# Patient Record
Sex: Male | Born: 1985 | Race: Black or African American | Hispanic: No | Marital: Single | Smoking: Never smoker
Health system: Southern US, Community
[De-identification: ages and names within clinical notes are randomized; demographics above are authoritative.]

## PROBLEM LIST (undated history)

## (undated) DIAGNOSIS — F79 Unspecified intellectual disabilities: Secondary | ICD-10-CM

## (undated) DIAGNOSIS — Q02 Microcephaly: Secondary | ICD-10-CM

## (undated) DIAGNOSIS — G809 Cerebral palsy, unspecified: Secondary | ICD-10-CM

## (undated) DIAGNOSIS — F840 Autistic disorder: Secondary | ICD-10-CM

## (undated) DIAGNOSIS — J189 Pneumonia, unspecified organism: Secondary | ICD-10-CM

## (undated) DIAGNOSIS — R5600 Simple febrile convulsions: Secondary | ICD-10-CM

## (undated) HISTORY — PX: DENTAL SURGERY: SHX609

---

## 2010-08-18 ENCOUNTER — Emergency Department (HOSPITAL_COMMUNITY): Admission: EM | Admit: 2010-08-18 | Payer: Self-pay | Source: Home / Self Care

## 2012-07-13 DIAGNOSIS — J189 Pneumonia, unspecified organism: Secondary | ICD-10-CM

## 2012-07-13 HISTORY — DX: Pneumonia, unspecified organism: J18.9

## 2013-02-13 ENCOUNTER — Inpatient Hospital Stay (HOSPITAL_COMMUNITY): Payer: Medicaid Other

## 2013-02-13 ENCOUNTER — Emergency Department (HOSPITAL_COMMUNITY): Payer: Medicaid Other

## 2013-02-13 ENCOUNTER — Encounter (HOSPITAL_COMMUNITY): Payer: Self-pay | Admitting: Radiology

## 2013-02-13 ENCOUNTER — Inpatient Hospital Stay (HOSPITAL_COMMUNITY)
Admission: EM | Admit: 2013-02-13 | Discharge: 2013-02-17 | DRG: 922 | Disposition: A | Discharge status: Home / Self Care | Payer: Medicaid Other | Attending: Internal Medicine | Admitting: Internal Medicine

## 2013-02-13 DIAGNOSIS — R4182 Altered mental status, unspecified: Secondary | ICD-10-CM | POA: Diagnosis present

## 2013-02-13 DIAGNOSIS — E872 Acidosis, unspecified: Secondary | ICD-10-CM | POA: Diagnosis present

## 2013-02-13 DIAGNOSIS — J690 Pneumonitis due to inhalation of food and vomit: Secondary | ICD-10-CM | POA: Diagnosis not present

## 2013-02-13 DIAGNOSIS — E876 Hypokalemia: Secondary | ICD-10-CM

## 2013-02-13 DIAGNOSIS — R6510 Systemic inflammatory response syndrome (SIRS) of non-infectious origin without acute organ dysfunction: Secondary | ICD-10-CM

## 2013-02-13 DIAGNOSIS — R000 Tachycardia, unspecified: Secondary | ICD-10-CM | POA: Diagnosis present

## 2013-02-13 DIAGNOSIS — J189 Pneumonia, unspecified organism: Secondary | ICD-10-CM

## 2013-02-13 DIAGNOSIS — G9340 Encephalopathy, unspecified: Secondary | ICD-10-CM

## 2013-02-13 DIAGNOSIS — T6701XA Heatstroke and sunstroke, initial encounter: Principal | ICD-10-CM

## 2013-02-13 DIAGNOSIS — Q02 Microcephaly: Secondary | ICD-10-CM

## 2013-02-13 DIAGNOSIS — R1110 Vomiting, unspecified: Secondary | ICD-10-CM | POA: Diagnosis not present

## 2013-02-13 DIAGNOSIS — X30XXXA Exposure to excessive natural heat, initial encounter: Secondary | ICD-10-CM | POA: Diagnosis present

## 2013-02-13 DIAGNOSIS — F840 Autistic disorder: Secondary | ICD-10-CM | POA: Diagnosis present

## 2013-02-13 DIAGNOSIS — I959 Hypotension, unspecified: Secondary | ICD-10-CM | POA: Diagnosis present

## 2013-02-13 DIAGNOSIS — IMO0002 Reserved for concepts with insufficient information to code with codable children: Secondary | ICD-10-CM | POA: Diagnosis not present

## 2013-02-13 DIAGNOSIS — J9600 Acute respiratory failure, unspecified whether with hypoxia or hypercapnia: Secondary | ICD-10-CM

## 2013-02-13 DIAGNOSIS — R5600 Simple febrile convulsions: Secondary | ICD-10-CM | POA: Diagnosis present

## 2013-02-13 DIAGNOSIS — T6701XD Heatstroke and sunstroke, subsequent encounter: Secondary | ICD-10-CM

## 2013-02-13 HISTORY — DX: Autistic disorder: F84.0

## 2013-02-13 HISTORY — DX: Unspecified intellectual disabilities: F79

## 2013-02-13 HISTORY — DX: Simple febrile convulsions: R56.00

## 2013-02-13 HISTORY — DX: Microcephaly: Q02

## 2013-02-13 LAB — URINE MICROSCOPIC-ADD ON

## 2013-02-13 LAB — CK: Total CK: 154 U/L (ref 7–232)

## 2013-02-13 LAB — POCT I-STAT 3, VENOUS BLOOD GAS (G3P V)
Acid-base deficit: 10 mmol/L — ABNORMAL HIGH (ref 0.0–2.0)
Bicarbonate: 18.3 meq/L — ABNORMAL LOW (ref 20.0–24.0)
O2 Saturation: 89 %
TCO2: 20 mmol/L (ref 0–100)
pCO2, Ven: 48.6 mmHg (ref 45.0–50.0)
pH, Ven: 7.183 — CL (ref 7.250–7.300)
pO2, Ven: 71 mmHg — ABNORMAL HIGH (ref 30.0–45.0)

## 2013-02-13 LAB — URINALYSIS, ROUTINE W REFLEX MICROSCOPIC
Bilirubin Urine: NEGATIVE
Glucose, UA: NEGATIVE mg/dL
Glucose, UA: 250 mg/dL — AB
Hgb urine dipstick: NEGATIVE
Ketones, ur: NEGATIVE mg/dL
Ketones, ur: NEGATIVE mg/dL
Nitrite: NEGATIVE
Protein, ur: 30 mg/dL — AB
Protein, ur: NEGATIVE mg/dL
Specific Gravity, Urine: 1.014 (ref 1.005–1.030)
Urobilinogen, UA: 0.2 mg/dL (ref 0.0–1.0)
pH: 7 (ref 5.0–8.0)

## 2013-02-13 LAB — COMPREHENSIVE METABOLIC PANEL
ALT: 13 U/L (ref 0–53)
AST: 24 U/L (ref 0–37)
Alkaline Phosphatase: 61 U/L (ref 39–117)
CO2: 20 mEq/L (ref 19–32)
Chloride: 107 mEq/L (ref 96–112)
GFR calc non Af Amer: 67 mL/min — ABNORMAL LOW (ref >90)
Sodium: 141 mEq/L (ref 135–145)
Total Bilirubin: 0.2 mg/dL — ABNORMAL LOW (ref 0.3–1.2)

## 2013-02-13 LAB — BLOOD GAS, ARTERIAL
Acid-base deficit: 6.2 mmol/L — ABNORMAL HIGH (ref 0.0–2.0)
FIO2: 0.5 %
MECHVT: 450 mL
Patient temperature: 98.6
TCO2: 19.9 mmol/L (ref 0–100)

## 2013-02-13 LAB — CBC WITH DIFFERENTIAL/PLATELET
Basophils Absolute: 0 10*3/uL (ref 0.0–0.1)
HCT: 41.6 % (ref 39.0–52.0)
Lymphocytes Relative: 66 % — ABNORMAL HIGH (ref 12–46)
Monocytes Absolute: 0.3 10*3/uL (ref 0.1–1.0)
Neutro Abs: 1.3 10*3/uL — ABNORMAL LOW (ref 1.7–7.7)
Platelets: 177 10*3/uL (ref 150–400)
RBC: 4.79 MIL/uL (ref 4.22–5.81)
RDW: 13.9 % (ref 11.5–15.5)
WBC: 4.8 10*3/uL (ref 4.0–10.5)

## 2013-02-13 LAB — CG4 I-STAT (LACTIC ACID): Lactic Acid, Venous: 3.81 mmol/L — ABNORMAL HIGH (ref 0.5–2.2)

## 2013-02-13 MED ORDER — SODIUM CHLORIDE 0.9 % IV SOLN
INTRAVENOUS | Status: DC
  Administered 2013-02-13 – 2013-02-15 (×3): via INTRAVENOUS

## 2013-02-13 MED ORDER — SODIUM CHLORIDE 0.9 % IV BOLUS (SEPSIS)
1000.0000 mL | Freq: Once | INTRAVENOUS | Status: AC
  Administered 2013-02-13: 1000 mL via INTRAVENOUS

## 2013-02-13 MED ORDER — FENTANYL BOLUS VIA INFUSION
25.0000 ug | Freq: Four times a day (QID) | INTRAVENOUS | Status: DC | PRN
  Filled 2013-02-13: qty 100

## 2013-02-13 MED ORDER — LORAZEPAM 2 MG/ML IJ SOLN
INTRAMUSCULAR | Status: AC
  Administered 2013-02-13: 2 mg via INTRAVENOUS
  Filled 2013-02-13: qty 1

## 2013-02-13 MED ORDER — SODIUM CHLORIDE 0.9 % IV SOLN
25.0000 ug/h | INTRAVENOUS | Status: DC
  Filled 2013-02-13 (×2): qty 50

## 2013-02-13 MED ORDER — SODIUM CHLORIDE 0.9 % IV SOLN: INTRAVENOUS | Status: DC

## 2013-02-13 MED ORDER — FAMOTIDINE IN NACL 20-0.9 MG/50ML-% IV SOLN
20.0000 mg | Freq: Two times a day (BID) | INTRAVENOUS | Status: DC
  Administered 2013-02-13 – 2013-02-16 (×6): 20 mg via INTRAVENOUS
  Filled 2013-02-13 (×7): qty 50

## 2013-02-13 MED ORDER — ROCURONIUM BROMIDE 50 MG/5ML IV SOLN
50.0000 mg | Freq: Once | INTRAVENOUS | Status: AC
  Administered 2013-02-13: 50 mg via INTRAVENOUS

## 2013-02-13 MED ORDER — MIDAZOLAM HCL 2 MG/2ML IJ SOLN: 2.0000 mg | Freq: Once | INTRAMUSCULAR | Status: DC

## 2013-02-13 MED ORDER — POTASSIUM CHLORIDE 10 MEQ/100ML IV SOLN
INTRAVENOUS | Status: AC
  Administered 2013-02-13: 10 meq
  Filled 2013-02-13: qty 400

## 2013-02-13 MED ORDER — DEXTROSE 5 % IV SOLN
1.0000 g | INTRAVENOUS | Status: DC
  Administered 2013-02-13 – 2013-02-16 (×4): 1 g via INTRAVENOUS
  Filled 2013-02-13 (×5): qty 10

## 2013-02-13 MED ORDER — KCL IN DEXTROSE-NACL 40-5-0.45 MEQ/L-%-% IV SOLN
INTRAVENOUS | Status: DC
  Filled 2013-02-13 (×3): qty 1000

## 2013-02-13 MED ORDER — POTASSIUM CHLORIDE 10 MEQ/100ML IV SOLN
10.0000 meq | INTRAVENOUS | Status: AC
  Administered 2013-02-13 (×4): 10 meq via INTRAVENOUS

## 2013-02-13 MED ORDER — SODIUM CHLORIDE 0.9 % IV SOLN
20.0000 ug/h | INTRAVENOUS | Status: DC
  Administered 2013-02-13: 20 ug/h via INTRAVENOUS
  Filled 2013-02-13: qty 50

## 2013-02-13 MED ORDER — LORAZEPAM 2 MG/ML IJ SOLN: 2.0000 mg | Freq: Once | INTRAMUSCULAR | Status: AC

## 2013-02-13 MED ORDER — DEXTROSE 5 % IV SOLN: 1.0000 g | INTRAVENOUS | Status: DC

## 2013-02-13 MED ORDER — SODIUM CHLORIDE 0.9 % IV SOLN
1.0000 mg/h | INTRAVENOUS | Status: DC
  Administered 2013-02-13: 1 mg/h via INTRAVENOUS
  Filled 2013-02-13: qty 10

## 2013-02-13 MED ORDER — NOREPINEPHRINE BITARTRATE 1 MG/ML IJ SOLN
2.0000 ug/min | INTRAVENOUS | Status: DC
  Filled 2013-02-13: qty 16

## 2013-02-13 MED ORDER — ETOMIDATE 2 MG/ML IV SOLN
15.0000 mg | Freq: Once | INTRAVENOUS | Status: AC
  Administered 2013-02-13: 15 mg via INTRAVENOUS

## 2013-02-13 MED ORDER — SODIUM CHLORIDE 0.9 % IV SOLN: 250.0000 mL | INTRAVENOUS | Status: DC | PRN

## 2013-02-13 MED ORDER — SODIUM CHLORIDE 0.9 % IV SOLN
250.0000 mL | INTRAVENOUS | Status: DC | PRN
  Administered 2013-02-14: 250 mL via INTRAVENOUS

## 2013-02-13 MED ORDER — VANCOMYCIN HCL IN DEXTROSE 750-5 MG/150ML-% IV SOLN
750.0000 mg | INTRAVENOUS | Status: DC
  Administered 2013-02-14 – 2013-02-16 (×3): 750 mg via INTRAVENOUS
  Filled 2013-02-13 (×3): qty 150

## 2013-02-13 MED ORDER — MIDAZOLAM HCL 2 MG/2ML IJ SOLN: 2.0000 mg | INTRAMUSCULAR | Status: DC | PRN

## 2013-02-13 NOTE — ED Notes (Signed)
MD made aware of temp trend. bair hugger placed on pt at this time.

## 2013-02-13 NOTE — ED Notes (Signed)
Capnography placed at this time pt 31

## 2013-02-13 NOTE — Progress Notes (Signed)
eLink Physician-Brief Progress Note Patient Name: Corey Mckenzie DOB: [birth year] MRN: [id number]  Date of Service  02/13/2013   HPI/Events of Note   MAP 50, lactic 3.9  eICU Interventions  Levophed, a line, bc sent, add vanc, line to consider   Intervention Category Major Interventions: Hypotension - evaluation and management  Patrycja Mumpower J. 02/13/2013, 10:20 PM
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
## 2013-02-13 NOTE — ED Notes (Signed)
Rate changed 20 fio2 60% peep 5 tv 400

## 2013-02-13 NOTE — ED Notes (Signed)
MD made aware of pt's temp. Pt changed to warm fluids, warm blankets applied

## 2013-02-13 NOTE — ED Notes (Signed)
[place] 6 L added at a this time

## 2013-02-13 NOTE — ED Notes (Signed)
MD at bedside. 

## 2013-02-13 NOTE — ED Notes (Signed)
MD at bedside. simonds

## 2013-02-13 NOTE — ED Notes (Signed)
[place] [employer] at bedside 

## 2013-02-13 NOTE — Progress Notes (Signed)
Patient transported to CT and back to 2100 without any complications.  Patient is slightly hypotensive.  Was given a bolus while in CT.  Sats 100%.  RT will continue to monitor.

## 2013-02-13 NOTE — ED Notes (Signed)
Ghim, MD notified of abnormal lab test results

## 2013-02-13 NOTE — Progress Notes (Signed)
Pt admitted from ED at 1705.  Pt placed on monitor. Pt intubated with fentanyl and versed infusing on admission. OGT replaced after pt admitted.  Mother at bedside will continue to monitor.

## 2013-02-13 NOTE — ED Notes (Signed)
RT called to pull back ETT pt now 23 at top lip
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
## 2013-02-13 NOTE — Progress Notes (Signed)
eLink Physician-Brief Progress Note Patient Name: Corey Mckenzie DOB: 03-Aug-1985 MRN: [id number]  Date of Service  02/13/2013   HPI/Events of Note   Rn called concern seziure, posturing At risk brain edema  eICU Interventions  Stat head ct (temp normal now, hard to blame hypertherthermia) Ativan, treat k, increase fluids, abg   Intervention Category Major Interventions: Seizures - evaluation and management  Nelda Bucks. 02/13/2013, 7:49 PM
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
## 2013-02-13 NOTE — ED Notes (Signed)
Rate seetinTidal volume 400 rate 12 peep 9fio2 80

## 2013-02-13 NOTE — ED Notes (Signed)
Cooling blanket applied at this time.
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
## 2013-02-13 NOTE — ED Notes (Signed)
Pt intubated 7.5 25 at the lips. + color change bilateral breath sounds.
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
## 2013-02-14 ENCOUNTER — Encounter (HOSPITAL_COMMUNITY): Payer: Self-pay | Admitting: General Practice

## 2013-02-14 ENCOUNTER — Inpatient Hospital Stay (HOSPITAL_COMMUNITY): Payer: Medicaid Other

## 2013-02-14 DIAGNOSIS — J9600 Acute respiratory failure, unspecified whether with hypoxia or hypercapnia: Secondary | ICD-10-CM

## 2013-02-14 LAB — CORTISOL: Cortisol, Plasma: 19.9 ug/dL

## 2013-02-14 LAB — URINE CULTURE
Colony Count: NO GROWTH
Culture: NO GROWTH

## 2013-02-14 LAB — CBC
HCT: 41.2 % (ref 39.0–52.0)
MCHC: 34.7 g/dL (ref 30.0–36.0)
Platelets: 78 10*3/uL — ABNORMAL LOW (ref 150–400)
RDW: 14.7 % (ref 11.5–15.5)

## 2013-02-14 LAB — BASIC METABOLIC PANEL
BUN: 12 mg/dL (ref 6–23)
Creatinine, Ser: 0.88 mg/dL (ref 0.50–1.35)
GFR calc Af Amer: >90 mL/min (ref >90)
GFR calc non Af Amer: >90 mL/min (ref >90)
Potassium: 4 mEq/L (ref 3.5–5.1)

## 2013-02-14 MED ORDER — ACETAMINOPHEN 650 MG RE SUPP
650.0000 mg | RECTAL | Status: DC | PRN
  Administered 2013-02-14: 650 mg via RECTAL
  Filled 2013-02-14: qty 1

## 2013-02-14 MED ORDER — WHITE PETROLATUM GEL
Status: AC
  Administered 2013-02-14: 20:00:00
  Filled 2013-02-14: qty 5

## 2013-02-14 MED ORDER — CHLORHEXIDINE GLUCONATE 0.12 % MT SOLN
15.0000 mL | Freq: Two times a day (BID) | OROMUCOSAL | Status: DC
  Administered 2013-02-14 – 2013-02-15 (×2): 15 mL via OROMUCOSAL
  Filled 2013-02-14: qty 15

## 2013-02-14 MED ORDER — FENTANYL BOLUS VIA INFUSION
50.0000 ug | Freq: Once | INTRAVENOUS | Status: DC
  Filled 2013-02-14: qty 50

## 2013-02-14 MED ORDER — BIOTENE DRY MOUTH MT LIQD
15.0000 mL | Freq: Four times a day (QID) | OROMUCOSAL | Status: DC
  Administered 2013-02-15 – 2013-02-16 (×7): 15 mL via OROMUCOSAL

## 2013-02-14 MED ORDER — ACETAMINOPHEN 325 MG PO TABS
650.0000 mg | ORAL_TABLET | ORAL | Status: DC | PRN
  Administered 2013-02-15 – 2013-02-16 (×4): 650 mg via ORAL
  Filled 2013-02-14 (×5): qty 2

## 2013-02-14 MED ORDER — CHLORHEXIDINE GLUCONATE 0.12 % MT SOLN
OROMUCOSAL | Status: AC
  Filled 2013-02-14: qty 15

## 2013-02-14 MED ORDER — FENTANYL CITRATE 0.05 MG/ML IJ SOLN
INTRAMUSCULAR | Status: AC
  Administered 2013-02-14: 18:00:00
  Filled 2013-02-14: qty 2

## 2013-02-14 MED ORDER — ONDANSETRON HCL 4 MG/2ML IJ SOLN: 4.0000 mg | Freq: Four times a day (QID) | INTRAMUSCULAR | Status: DC | PRN

## 2013-02-14 MED ORDER — SODIUM CHLORIDE 0.9 % IV SOLN
1.0000 g | Freq: Once | INTRAVENOUS | Status: AC
  Administered 2013-02-14: 1 g via INTRAVENOUS
  Filled 2013-02-14: qty 10

## 2013-02-14 MED ORDER — ONDANSETRON HCL 4 MG/2ML IJ SOLN
INTRAMUSCULAR | Status: AC
  Filled 2013-02-14: qty 2

## 2013-02-14 NOTE — Progress Notes (Signed)
Patient extubated to Liters, mentation at baseline. Limited ability to follow commands. Tolerated well. Current BP 103/49, heart rate 105. Dangling in bed. Slightly lethargic. Family and caretaker at bedside

## 2013-02-14 NOTE — Progress Notes (Signed)
Attempted times two to place NGT with times three persons. Patient fighting and struggling against NGT placement. Family at bedside. Dr. Tyson Alias aware.

## 2013-02-14 NOTE — Progress Notes (Signed)
Patient taken to CT and back by this RN, transporter and respiratory therapist. Patient slightly hypotensive in CT and was given 1L bolus. Patient family at bedside.

## 2013-02-14 NOTE — Progress Notes (Signed)
Chaplain Note:  Chaplain visited with pt and pt's family.  Pt was in bed, awake, alert, and restless.  Pt's family and friends were bedside in support of pt.  Chaplain provided spiritual comfort, support, and prayer for pt and pt's family.  Pt's family and nurse expressed appreciation for chaplain support.  Chaplain will follow up as needed.  02/14/13 1400  Clinical Encounter Type  Visited With Patient and family together  Visit Type Spiritual support  Referral From Nurse  Spiritual Encounters  Spiritual Needs Prayer;Emotional  Stress Factors  Patient Stress Factors Health changes;Loss of control  Family Stress Factors Loss of control  Verdie Shire, [address] [phone]

## 2013-02-14 NOTE — Progress Notes (Signed)
Foley discontinued at 1220

## 2013-02-14 NOTE — Progress Notes (Signed)
eLink Physician-Brief Progress Note Patient Name: Corey Mckenzie DOB: Aug 30, 1985 MRN: [id number]  Date of Service  02/14/2013   HPI/Events of Note  Corrected calcium of 7.1   eICU Interventions  Plan: Calcium replaced   Intervention Category Intermediate Interventions: Electrolyte abnormality - evaluation and management  DETERDING,ELIZABETH 02/14/2013, 5:19 AM

## 2013-02-14 NOTE — Progress Notes (Signed)
Called to evaluate patient for respiratory weaning.  After evaluation, ABG was noted and spoke with the family, patient is baseline non-verbal and non communicative.  After observing for 15 minutes, decision was made to extubate.  Will extubate but maintain full code for now.  Additional CC time 40 min.  Alyson Reedy, M.D. [facility] Pulmonary/Critical Care Medicine. Pager: [phone]. After hours pager: [phone].
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
## 2013-02-14 NOTE — Procedures (Signed)
Extubation Procedure Note  Patient Details:   Name: Corey Mckenzie DOB: 10-01-1985 MRN: [id number]   Airway Documentation:     Evaluation  O2 sats: stable throughout Complications: No apparent complications Patient did not tolerate procedure well. Bilateral Breath Sounds: Clear Suctioning: Airway Yes Pt extubated per MD order and placed on 4L . No stridor or other complications noted. RT will monitor.   Kristian Covey Arlene 02/14/2013, 9:41 AM

## 2013-02-14 NOTE — Progress Notes (Signed)
Started weaning at 8:37. 5/5, patient tolerating well. Currently on fentanyl 100
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
## 2013-02-14 NOTE — Progress Notes (Signed)
eLink Physician-Brief Progress Note Patient Name: Corey Mckenzie DOB: 1986/06/12 MRN: [id number]  Date of Service  02/14/2013   HPI/Events of Note  New infiltrate fro vomiting, awake, rr noted, maintaining airway well for now   eICU Interventions  Place ngt to suction, kub   Intervention Category Major Interventions: Respiratory failure - evaluation and management  FEINSTEIN,DANIEL J. 02/14/2013, 4:00 PM

## 2013-02-15 DIAGNOSIS — J189 Pneumonia, unspecified organism: Secondary | ICD-10-CM | POA: Diagnosis present

## 2013-02-15 DIAGNOSIS — Z5189 Encounter for other specified aftercare: Secondary | ICD-10-CM

## 2013-02-15 LAB — BASIC METABOLIC PANEL
BUN: 8 mg/dL (ref 6–23)
CO2: 14 mEq/L — ABNORMAL LOW (ref 19–32)
Calcium: 7.8 mg/dL — ABNORMAL LOW (ref 8.4–10.5)
Chloride: 104 mEq/L (ref 96–112)
Creatinine, Ser: 0.8 mg/dL (ref 0.50–1.35)
Glucose, Bld: 70 mg/dL (ref 70–99)
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
## 2013-02-16 DIAGNOSIS — J9600 Acute respiratory failure, unspecified whether with hypoxia or hypercapnia: Secondary | ICD-10-CM

## 2013-02-16 LAB — CBC
HCT: 44.4 % (ref 39.0–52.0)
MCH: 26.3 pg (ref 26.0–34.0)
MCHC: 31.5 g/dL (ref 30.0–36.0)
MCV: 83.3 fL (ref 78.0–100.0)
Platelets: 81 10*3/uL — ABNORMAL LOW (ref 150–400)
RDW: 14.5 % (ref 11.5–15.5)
WBC: 10 10*3/uL (ref 4.0–10.5)

## 2013-02-16 LAB — BASIC METABOLIC PANEL
BUN: 8 mg/dL (ref 6–23)
Calcium: 8.1 mg/dL — ABNORMAL LOW (ref 8.4–10.5)
Creatinine, Ser: 0.74 mg/dL (ref 0.50–1.35)
GFR calc Af Amer: >90 mL/min (ref >90)

## 2013-02-16 MED ORDER — ENSURE COMPLETE PO LIQD
237.0000 mL | Freq: Two times a day (BID) | ORAL | Status: DC
  Administered 2013-02-16 – 2013-02-17 (×2): 237 mL via ORAL

## 2013-02-16 MED ORDER — KCL IN DEXTROSE-NACL 10-5-0.45 MEQ/L-%-% IV SOLN
INTRAVENOUS | Status: DC
  Administered 2013-02-16 – 2013-02-17 (×2): via INTRAVENOUS
  Filled 2013-02-16 (×4): qty 1000

## 2013-02-16 MED ORDER — POTASSIUM CHLORIDE 10 MEQ/100ML IV SOLN
10.0000 meq | INTRAVENOUS | Status: AC
  Administered 2013-02-16 (×4): 10 meq via INTRAVENOUS
  Filled 2013-02-16 (×5): qty 100
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
## 2013-02-16 NOTE — Progress Notes (Signed)
Report given to [facility] on 6N. Last set of vitals were taken. O2 96 HR 102 Resp 32 BP 97/69 (75). Pt is being transferred to 6N11
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
## 2013-02-17 DIAGNOSIS — G9340 Encephalopathy, unspecified: Secondary | ICD-10-CM

## 2013-02-17 DIAGNOSIS — E872 Acidosis, unspecified: Secondary | ICD-10-CM

## 2013-02-17 DIAGNOSIS — T6701XA Heatstroke and sunstroke, initial encounter: Secondary | ICD-10-CM

## 2013-02-17 MED ORDER — LEVOFLOXACIN 25 MG/ML PO SOLN: 500.0000 mg | Freq: Every day | ORAL | Status: DC

## 2013-02-17 MED ORDER — POTASSIUM CHLORIDE 10 MEQ/100ML IV SOLN
10.0000 meq | INTRAVENOUS | Status: AC
  Administered 2013-02-17 (×2): 10 meq via INTRAVENOUS
  Filled 2013-02-17: qty 200
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
## 2013-02-17 NOTE — Progress Notes (Signed)
DC home with grandmother and brother. They both verbally understood DC instructions, no questions asked.
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
## 2013-02-19 LAB — CULTURE, BLOOD (ROUTINE X 2): Culture: NO GROWTH

## 2014-03-22 IMAGING — CT CT HEAD W/O CM
1 series · 16 of 30 positions shown, 20 images · non-contrast
Comparison: None.

CLINICAL DATA: Seizure activity

CT HEAD WITHOUT CONTRAST
TECHNIQUE: Contiguous axial images were obtained from the base of
the skull through the vertex without contrast.

[Series 2: head 5.0 h30s · axial · 0.46mm/px · z∈[-101,+39]mm · 16 of 32 slices shown, 20 images]
[im 2/32  brain]
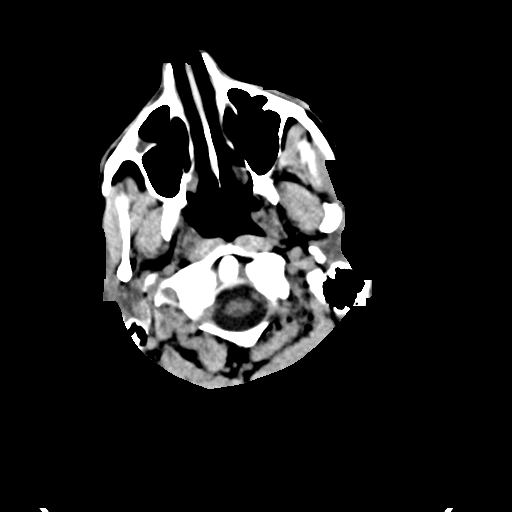
[im 2/32  bone]
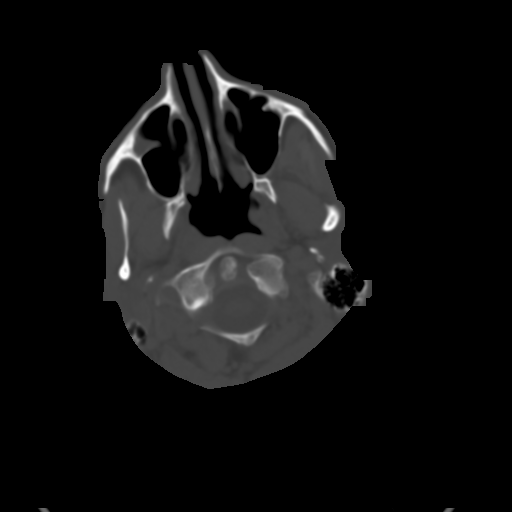
[im 4/32  brain]
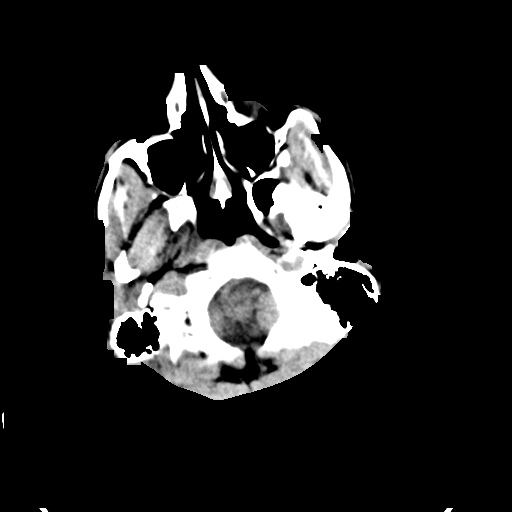
[im 6/32  brain]
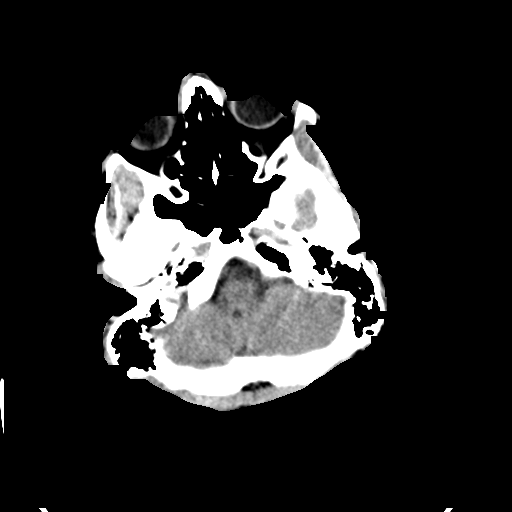
[im 8/32  brain]
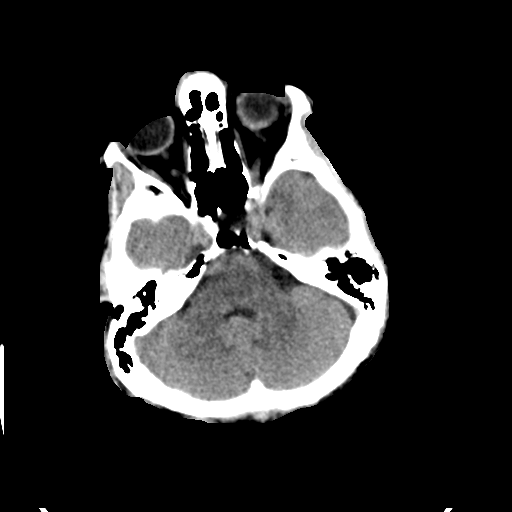
[im 9/32  brain]
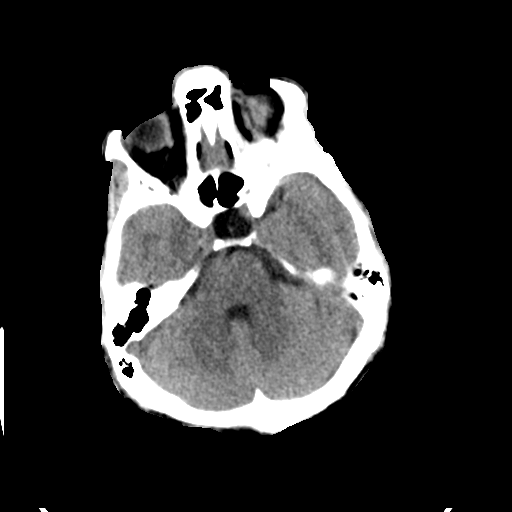
[im 9/32  bone]
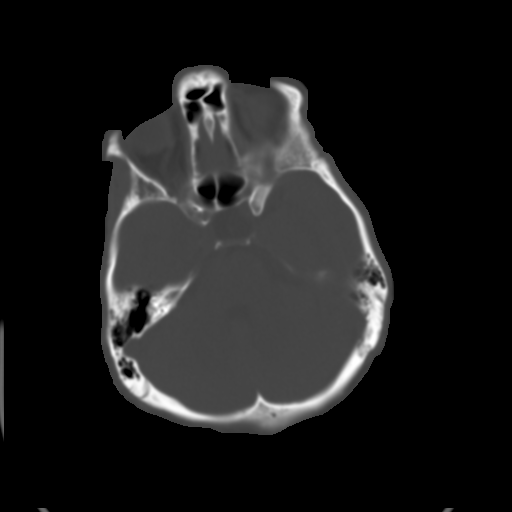
[im 11/32  brain]
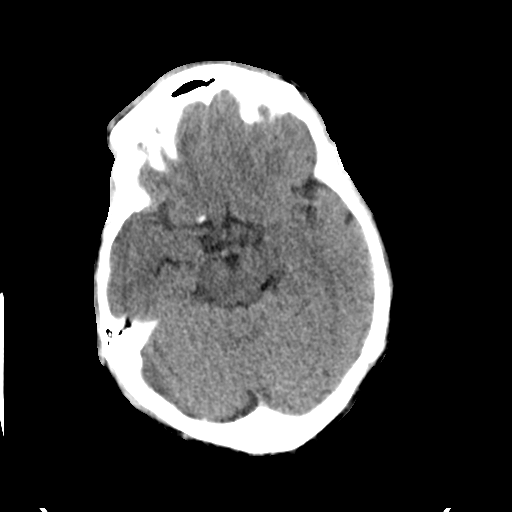
[im 13/32  brain]
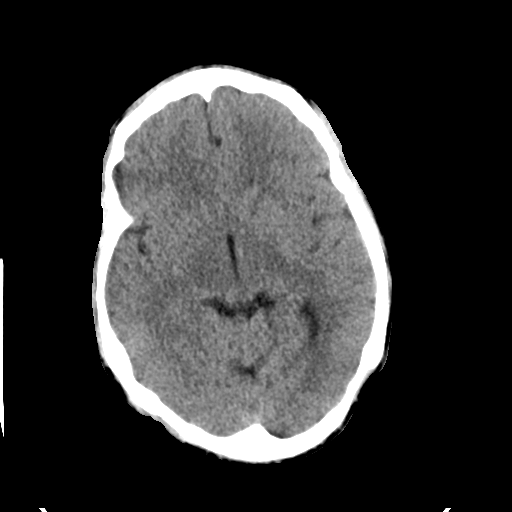
[im 15/32  brain]
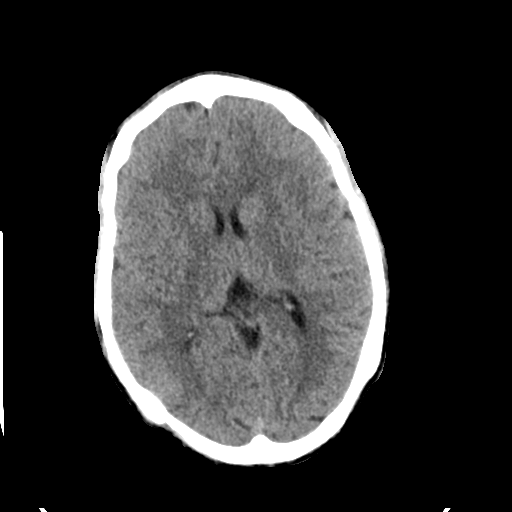
[im 17/32  brain]
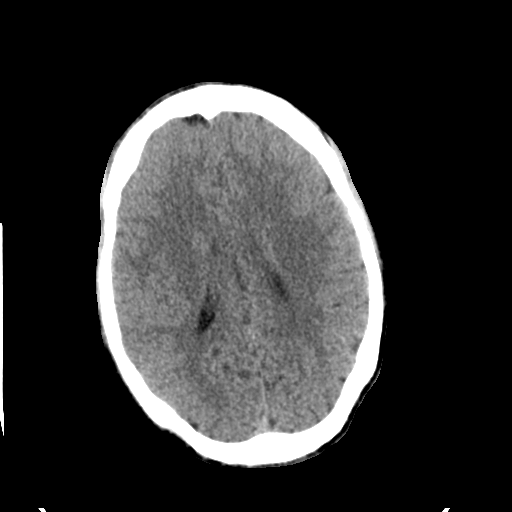
[im 17/32  bone]
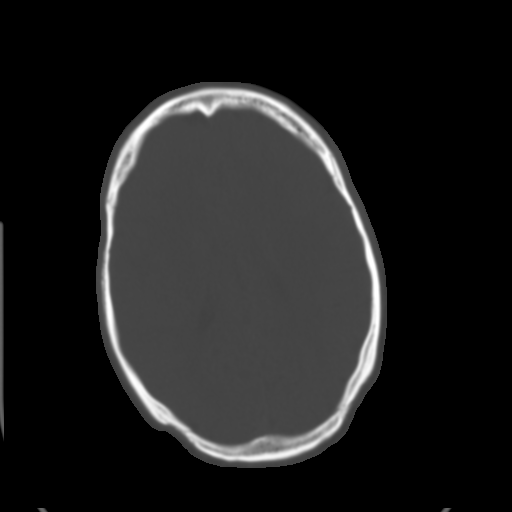
[im 19/32  brain]
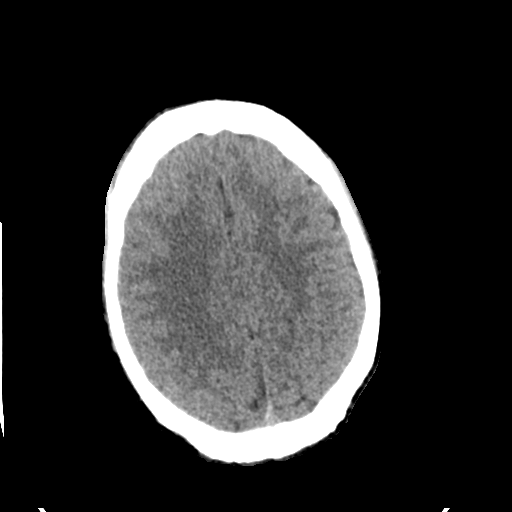
[im 21/32  brain]
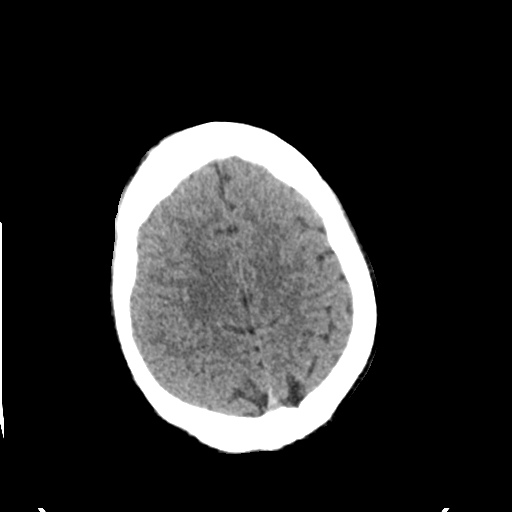
[im 23/32  brain]
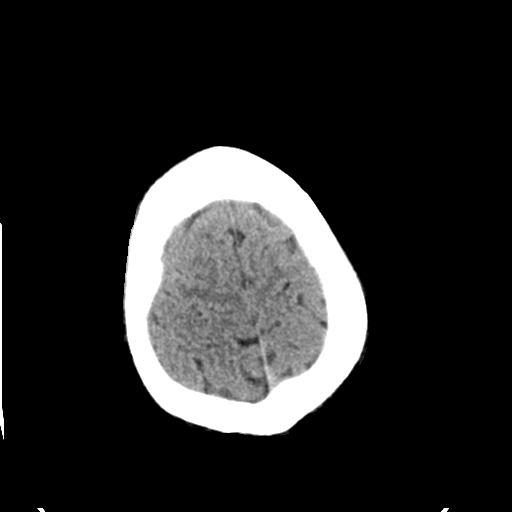
[im 24/32  brain]
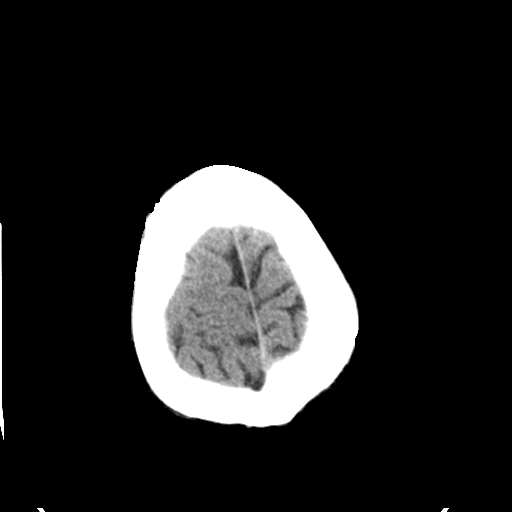
[im 24/32  bone]
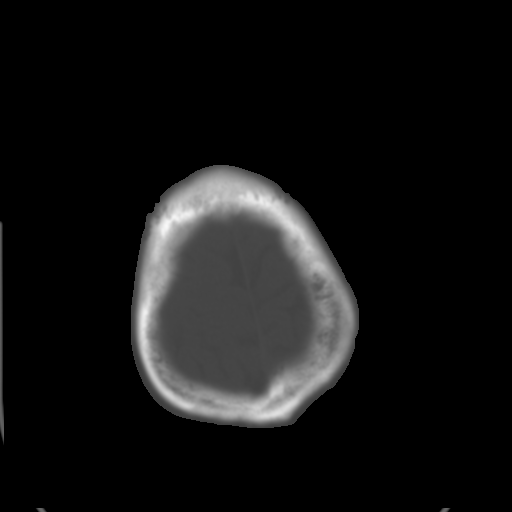
[im 26/32  brain]
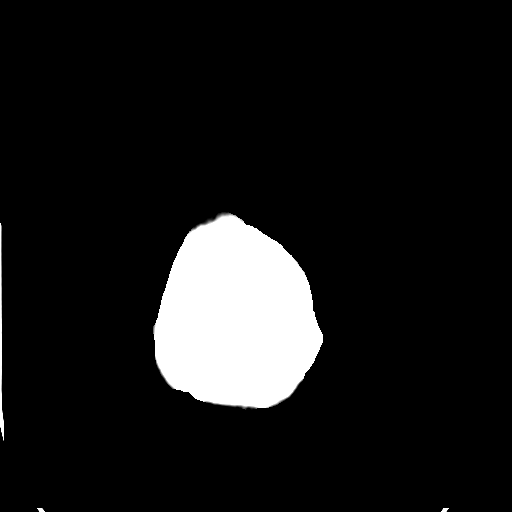
[im 28/32  brain]
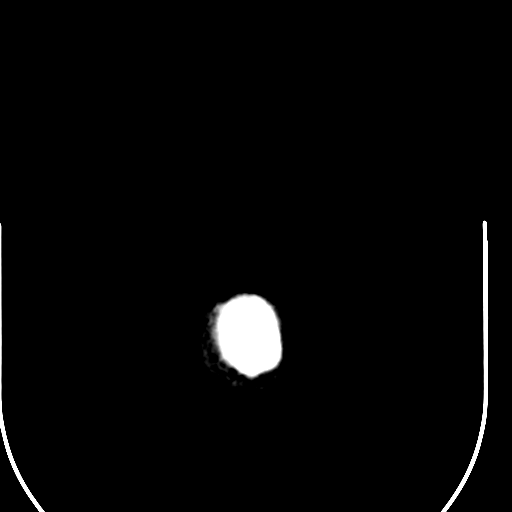
[im 30/32  brain]
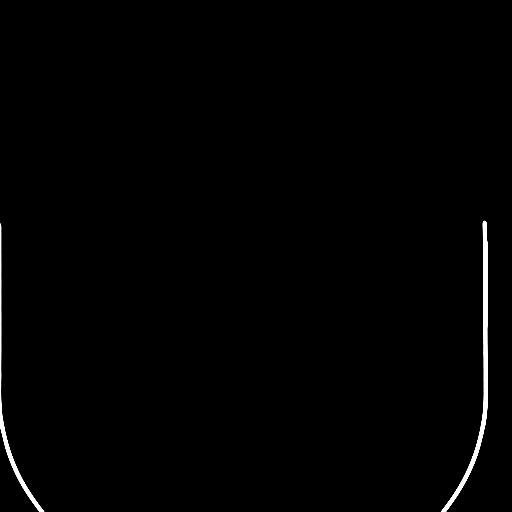

[16 of 30 positions shown; findings below may reference images not displayed]

FINDINGS: No acute intracranial hemorrhage, mass lesion,
infarction, mass effect, hydrocephalus, herniation, or extra-axial
fluid collection.  Gray-white matter differentiation maintained.
Cisterns patent.  No cerebellar abnormality.  Symmetric orbits.
Mastoids clear.  Minor mucosal thickening in the right maxillary
sinus.  Other sinuses clear.
IMPRESSION: No acute intracranial finding

## 2014-03-23 IMAGING — CR DG CHEST 1V PORT
1 series · 1 of 1 positions shown · non-contrast
Comparison: [DATE] [DATE], [DATE] [DATE] a.m.

CLINICAL DATA: Shortness of breath and vomiting

PORTABLE CHEST - 1 VIEW

[AP]
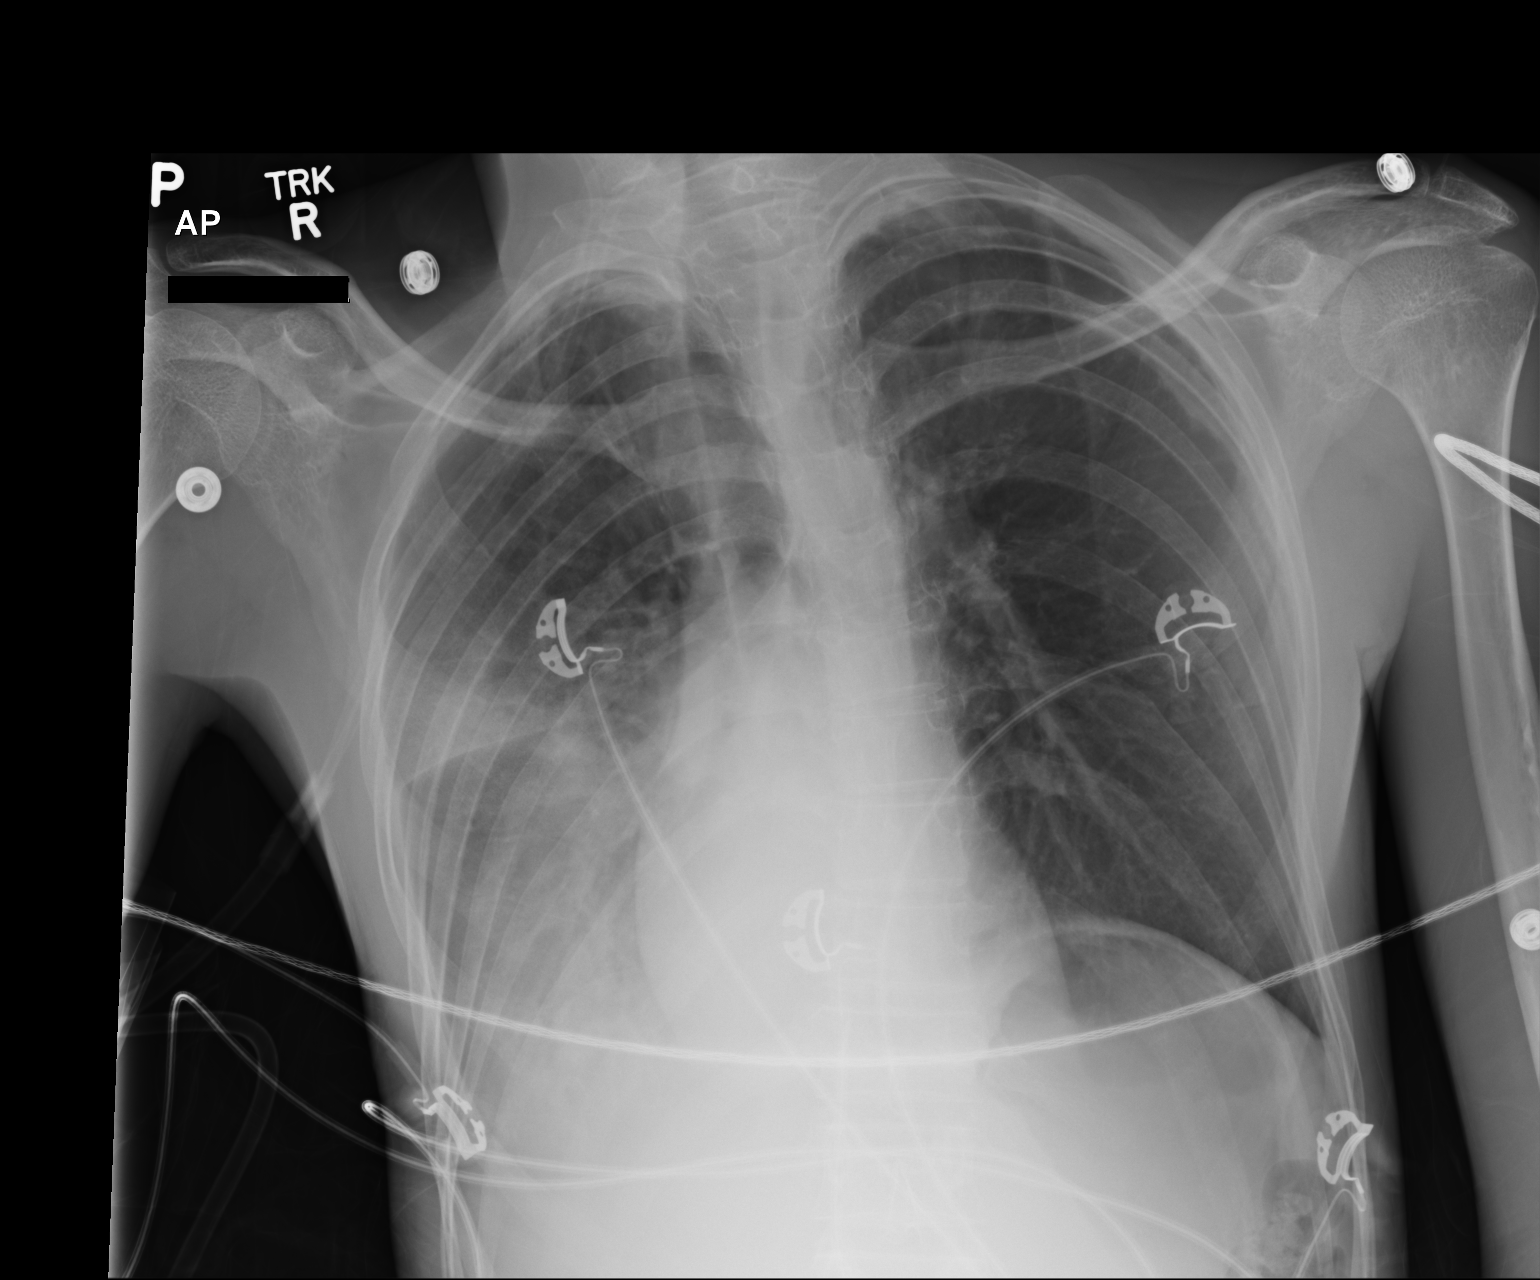

[1 of 1 positions shown; findings below may reference images not displayed]

FINDINGS: Previously noted endotracheal tube has been removed.
Previously noted nasogastric tube has been removed.  There is
consolidation of right mid and lung base with right pleural
effusion worse compared prior exam.  The left lung is clear.  The
heart size is normal.
IMPRESSION: Right pleural effusion and consolidation of right mid and lung base
worse compared to prior exam.

## 2014-06-20 ENCOUNTER — Other Ambulatory Visit: Payer: Self-pay | Admitting: Dentistry

## 2014-06-20 NOTE — Pre-Procedure Instructions (Signed)
Beryl NestJason A Hatler  06/20/2014   Your procedure is scheduled on:  Friday, June 29, 2014 at 12:40 PM.   [facility] Entrance "A" Admitting Offfice at 10:40 AM.   Call this number if you have problems the morning of surgery: [phone]                Any questions prior to day of surgery, please call [phone] between 8 & 4PM.    Remember:   Do not eat food or drink liquids after midnight.   Take these medicines the morning of surgery with A SIP OF WATER:    Do not wear jewelry.  Do not wear lotions, powders, or cologne. You may wear deodorant.  Men may shave face and neck.  Do not bring valuables to the hospital.  [facility] is not responsible                  for any belongings or valuables.               Contacts, dentures or bridgework may not be worn into surgery.  Leave suitcase in the car. After surgery it may be brought to your room.  For patients admitted to the hospital, discharge time is determined by your                treatment team.               Patients discharged the day of surgery will not be allowed to drive home.    Special Instructions: See "Preparing for Surgery"   Please read over the following fact sheets that you were given: Pain Booklet, Coughing and Deep Breathing and Surgical Site Infection Prevention

## 2014-06-21 ENCOUNTER — Encounter (HOSPITAL_COMMUNITY): Payer: Self-pay

## 2014-06-21 ENCOUNTER — Encounter (HOSPITAL_COMMUNITY)
Admission: RE | Admit: 2014-06-21 | Discharge: 2014-06-21 | Disposition: A | Discharge status: Home / Self Care | Payer: Medicaid Other | Source: Ambulatory Visit | Attending: Dentistry | Admitting: Dentistry

## 2014-06-21 DIAGNOSIS — Z01818 Encounter for other preprocedural examination: Secondary | ICD-10-CM | POA: Insufficient documentation

## 2014-06-21 DIAGNOSIS — F849 Pervasive developmental disorder, unspecified: Secondary | ICD-10-CM | POA: Diagnosis not present

## 2014-06-21 DIAGNOSIS — R5600 Simple febrile convulsions: Secondary | ICD-10-CM | POA: Insufficient documentation

## 2014-06-21 DIAGNOSIS — F840 Autistic disorder: Secondary | ICD-10-CM | POA: Diagnosis not present

## 2014-06-21 DIAGNOSIS — Q02 Microcephaly: Secondary | ICD-10-CM | POA: Diagnosis not present

## 2014-06-21 HISTORY — DX: Pneumonia, unspecified organism: J18.9

## 2014-06-21 NOTE — Pre-Procedure Instructions (Signed)
Corey Mckenzie  06/21/2014   Your procedure is scheduled on:  Friday, June 29, 2014 at 12:40 PM.   [facility] to [facility] Entrance "A" Admitting Offfice at 10:40 AM.   Call this number if you have problems the morning of surgery: [phone]                Any questions prior to day of surgery, please call [phone] between 8 & 4PM.    Remember:   Do not eat food or drink liquids after midnight.   Take these medicines the morning of surgery with A SIP OF WATER: NONE   Do not wear jewelry.  Do not wear lotions, powders, or cologne. You may wear deodorant.  Men may shave face and neck.  Do not bring valuables to the hospital.  [facility] is not responsible for any belongings or valuables.               Contacts, dentures or bridgework may not be worn into surgery.  Leave suitcase in the car. After surgery it may be brought to your room.  For patients admitted to the hospital, discharge time is determined by your treatment team.               Patients discharged the day of surgery will not be allowed to drive home.    Special Instructions: See "Preparing for Surgery"   Please read over the following fact sheets that you were given: Pain Booklet, Coughing and Deep Breathing and Surgical Site Infection Prevention

## 2014-06-21 NOTE — Progress Notes (Signed)
Patient is autistic. Patients mother Deneise at chair side during PAT visit. PCP is Dr. Juanita CraverGessemi in [place]. Office # [phone] at fax # [phone]. Patients mother informed Nurse that lab work could not be obtained today and that labs would need to be obtained after patient was asleep. Nurse called Revonda StandardAllison, PA and informed her of patient.
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
## 2014-06-28 NOTE — Progress Notes (Signed)
Patient aware of surgery time change to 1012 AM and arrival time to 0800 AM.

## 2014-06-29 ENCOUNTER — Ambulatory Visit (HOSPITAL_COMMUNITY): Payer: Medicaid Other | Admitting: Vascular Surgery

## 2014-06-29 ENCOUNTER — Encounter (HOSPITAL_COMMUNITY)
Admission: RE | Disposition: A | Discharge status: Home / Self Care | Payer: Self-pay | Source: Ambulatory Visit | Attending: Dentistry

## 2014-06-29 ENCOUNTER — Ambulatory Visit (HOSPITAL_COMMUNITY)
Admission: RE | Admit: 2014-06-29 | Discharge: 2014-06-29 | Disposition: A | Discharge status: Home / Self Care | Payer: Medicaid Other | Source: Ambulatory Visit | Attending: Dentistry | Admitting: Dentistry

## 2014-06-29 DIAGNOSIS — Z8669 Personal history of other diseases of the nervous system and sense organs: Secondary | ICD-10-CM | POA: Diagnosis not present

## 2014-06-29 DIAGNOSIS — K029 Dental caries, unspecified: Secondary | ICD-10-CM | POA: Insufficient documentation

## 2014-06-29 DIAGNOSIS — I10 Essential (primary) hypertension: Secondary | ICD-10-CM | POA: Diagnosis not present

## 2014-06-29 DIAGNOSIS — Z8701 Personal history of pneumonia (recurrent): Secondary | ICD-10-CM | POA: Insufficient documentation

## 2014-06-29 DIAGNOSIS — F849 Pervasive developmental disorder, unspecified: Secondary | ICD-10-CM | POA: Insufficient documentation

## 2014-06-29 DIAGNOSIS — F79 Unspecified intellectual disabilities: Secondary | ICD-10-CM | POA: Diagnosis not present

## 2014-06-29 DIAGNOSIS — Q02 Microcephaly: Secondary | ICD-10-CM | POA: Diagnosis not present

## 2014-06-29 HISTORY — PX: DENTAL RESTORATION/EXTRACTION WITH X-RAY: SHX5796

## 2014-06-29 SURGERY — DENTAL RESTORATION/EXTRACTION WITH X-RAY
Anesthesia: General | Site: Mouth

## 2014-06-29 MED ORDER — ONDANSETRON HCL 4 MG/2ML IJ SOLN
INTRAMUSCULAR | Status: AC
  Filled 2014-06-29: qty 2

## 2014-06-29 MED ORDER — SUCCINYLCHOLINE CHLORIDE 20 MG/ML IJ SOLN
INTRAMUSCULAR | Status: DC | PRN
  Administered 2014-06-29: 100 mg via INTRAVENOUS

## 2014-06-29 MED ORDER — LIDOCAINE HCL (CARDIAC) 20 MG/ML IV SOLN
INTRAVENOUS | Status: DC | PRN
  Administered 2014-06-29: 50 mg via INTRAVENOUS

## 2014-06-29 MED ORDER — ROCURONIUM BROMIDE 50 MG/5ML IV SOLN
INTRAVENOUS | Status: AC
  Filled 2014-06-29: qty 1

## 2014-06-29 MED ORDER — STERILE WATER FOR IRRIGATION IR SOLN
Status: DC | PRN
  Administered 2014-06-29: 1000 mL

## 2014-06-29 MED ORDER — PROPOFOL 10 MG/ML IV BOLUS
INTRAVENOUS | Status: AC
  Filled 2014-06-29: qty 20

## 2014-06-29 MED ORDER — LIDOCAINE-EPINEPHRINE 2 %-1:100000 IJ SOLN
INTRAMUSCULAR | Status: DC | PRN
Start: 2014-06-29 — End: 2014-06-29
  Administered 2014-06-29: 20 mL

## 2014-06-29 MED ORDER — LACTATED RINGERS IV SOLN
INTRAVENOUS | Status: DC | PRN
  Administered 2014-06-29: 11:00:00 via INTRAVENOUS

## 2014-06-29 MED ORDER — PHENYLEPHRINE HCL 10 MG/ML IJ SOLN
INTRAMUSCULAR | Status: DC | PRN
  Administered 2014-06-29 (×2): 80 ug via INTRAVENOUS

## 2014-06-29 MED ORDER — MIDAZOLAM HCL 2 MG/ML PO SYRP
ORAL_SOLUTION | ORAL | Status: AC
  Filled 2014-06-29: qty 10

## 2014-06-29 MED ORDER — HYDROMORPHONE HCL 1 MG/ML IJ SOLN: 0.2500 mg | INTRAMUSCULAR | Status: DC | PRN

## 2014-06-29 MED ORDER — LIDOCAINE-EPINEPHRINE 2 %-1:100000 IJ SOLN
INTRAMUSCULAR | Status: AC
  Filled 2014-06-29: qty 1

## 2014-06-29 MED ORDER — MIDAZOLAM HCL 2 MG/ML PO SYRP
20.0000 mg | ORAL_SOLUTION | Freq: Once | ORAL | Status: AC
  Administered 2014-06-29: 20 mg via ORAL

## 2014-06-29 MED ORDER — PROPOFOL 10 MG/ML IV BOLUS
INTRAVENOUS | Status: DC | PRN
  Administered 2014-06-29: 100 mg via INTRAVENOUS

## 2014-06-29 MED ORDER — LIDOCAINE HCL (CARDIAC) 20 MG/ML IV SOLN
INTRAVENOUS | Status: AC
  Filled 2014-06-29: qty 5

## 2014-06-29 MED ORDER — FENTANYL CITRATE 0.05 MG/ML IJ SOLN
INTRAMUSCULAR | Status: DC | PRN
  Administered 2014-06-29: 50 ug via INTRAVENOUS

## 2014-06-29 MED ORDER — ONDANSETRON HCL 4 MG/2ML IJ SOLN
INTRAMUSCULAR | Status: DC | PRN
  Administered 2014-06-29: 4 mg via INTRAVENOUS

## 2014-06-29 MED ORDER — FENTANYL CITRATE 0.05 MG/ML IJ SOLN
INTRAMUSCULAR | Status: AC
  Filled 2014-06-29: qty 5

## 2014-06-29 SURGICAL SUPPLY — 25 items
CANISTER SUCTION 2500CC (MISCELLANEOUS) ×3 IMPLANT
COVER PROBE W GEL 5X96 (DRAPES) ×3 IMPLANT
COVER SURGICAL LIGHT HANDLE (MISCELLANEOUS) ×3 IMPLANT
COVER TABLE BACK 60X90 (DRAPES) ×3 IMPLANT
DRAPE PROXIMA HALF (DRAPES) ×3 IMPLANT
ELECT REM PT RETURN 9FT ADLT (ELECTROSURGICAL) ×3
ELECTRODE REM PT RTRN 9FT ADLT (ELECTROSURGICAL) ×1 IMPLANT
GAUZE SPONGE 4X4 16PLY XRAY LF (GAUZE/BANDAGES/DRESSINGS) ×3 IMPLANT
GLOVE BIO SURGEON STRL SZ7.5 (GLOVE) ×3 IMPLANT
GOWN STRL REUS W/ TWL LRG LVL3 (GOWN DISPOSABLE) ×2 IMPLANT
GOWN STRL REUS W/TWL LRG LVL3 (GOWN DISPOSABLE) ×4
KIT BASIN OR (CUSTOM PROCEDURE TRAY) ×3 IMPLANT
KIT ROOM TURNOVER OR (KITS) ×3 IMPLANT
NEEDLE 25GX 5/8IN NON SAFETY (NEEDLE) ×3 IMPLANT
NEEDLE 27GAX1X1/2 (NEEDLE) IMPLANT
PAD ARMBOARD 7.5X6 YLW CONV (MISCELLANEOUS) ×6 IMPLANT
PENCIL BUTTON HOLSTER BLD 10FT (ELECTRODE) ×3 IMPLANT
SPONGE SURGIFOAM ABS GEL SZ50 (HEMOSTASIS) ×3 IMPLANT
SUT CHROMIC 3 0 PS 2 (SUTURE) ×3 IMPLANT
SYR CONTROL 10ML LL (SYRINGE) ×3 IMPLANT
TOOTHBRUSH ADULT (PERSONAL CARE ITEMS) ×3 IMPLANT
TOWEL OR 17X26 10 PK STRL BLUE (TOWEL DISPOSABLE) ×3 IMPLANT
TUBE CONNECTING 12'X1/4 (SUCTIONS) ×1
TUBE CONNECTING 12X1/4 (SUCTIONS) ×2 IMPLANT
YANKAUER SUCT BULB TIP NO VENT (SUCTIONS) ×3 IMPLANT

## 2014-06-29 NOTE — Anesthesia Preprocedure Evaluation (Addendum)
Anesthesia Evaluation  Patient identified by MRN, date of birth, ID band Patient awake    Reviewed: Allergy & Precautions, H&P , NPO status , Patient's Chart, lab work & pertinent test results  Airway Mallampati: III  TM Distance: >3 FB Neck ROM: Full    Dental no notable dental hx. (+) Teeth Intact, Dental Advisory Given   Pulmonary neg pulmonary ROS, pneumonia -, resolved,  breath sounds clear to auscultation  Pulmonary exam normal       Cardiovascular negative cardio ROS  Rhythm:Regular Rate:Normal     Neuro/Psych Seizures -, Well Controlled,  Autism Mental retardation negative psych ROS   GI/Hepatic negative GI ROS, Neg liver ROS,   Endo/Other  negative endocrine ROS  Renal/GU negative Renal ROS  negative genitourinary   Musculoskeletal   Abdominal   Peds  Hematology negative hematology ROS (+)   Anesthesia Other Findings   Reproductive/Obstetrics negative OB ROS                            Anesthesia Physical Anesthesia Plan  ASA: III  Anesthesia Plan: General   Post-op Pain Management:    Induction: Inhalational  Airway Management Planned: Nasal ETT and Video Laryngoscope Planned  Additional Equipment:   Intra-op Plan:   Post-operative Plan: Extubation in OR  Informed Consent: I have reviewed the patients History and Physical, chart, labs and discussed the procedure including the risks, benefits and alternatives for the proposed anesthesia with the patient or authorized representative who has indicated his/her understanding and acceptance.   Dental advisory given  Plan Discussed with: CRNA  Anesthesia Plan Comments:         Anesthesia Quick Evaluation

## 2014-06-29 NOTE — H&P (Signed)
H&P reviewed, no changes in assessment  

## 2014-06-29 NOTE — Discharge Instructions (Signed)
Given to mother

## 2014-06-29 NOTE — Transfer of Care (Signed)
Immediate Anesthesia Transfer of Care Note  Patient: Corey Mckenzie  Procedure(s) Performed: Procedure(s) with comments: DENTAL RESTORATION/EXTRACTION WITH X-RAY (N/A) - Recall exam, full mouth x-rays and cleaning, two extractions and fillings  Patient Location: PACU  Anesthesia Type:General  Level of Consciousness: awake  Airway & Oxygen Therapy: Patient Spontanous Breathing  Post-op Assessment: Report given to PACU RN  Post vital signs: Reviewed and stable  Complications: No apparent anesthesia complications

## 2014-06-29 NOTE — Op Note (Signed)
Recall Exam, FMX, FMD, Composites, Extractions 13, 15 with 5 cc 2% Lidocaine 1:100,000 epi, 15 cc EBL

## 2014-06-29 NOTE — Brief Op Note (Signed)
06/29/2014  1:30 PM  PATIENT:  Corey Mckenzie  28 y.o. male  PRE-OPERATIVE DIAGNOSIS:  DENTAL CARIES  POST-OPERATIVE DIAGNOSIS:  DENTAL CARIES  PROCEDURE:  Procedure(s): DENTAL RESTORATION/EXTRACTION WITH X-RAY (N/A)  SURGEON:  Surgeon(s) and Role:    * Esaw DaceWilliam E Jenah Vanasten Jr., DDS - Primary  PHYSICIAN ASSISTANT:   ASSISTANTS: Laqueta CarinaMary Elizabeth Andreyev   ANESTHESIA:   general  EBL:  Total I/O In: 300 [I.V.:300] Out: 15 [Blood:15]  BLOOD ADMINISTERED:none  DRAINS: none   LOCAL MEDICATIONS USED:  LIDOCAINE   SPECIMEN:  No Specimen  DISPOSITION OF SPECIMEN:  N/A  COUNTS:  YES  TOURNIQUET:  * No tourniquets in log *  DICTATION: .Note written in EPIC  PLAN OF CARE: Discharge to home after PACU  PATIENT DISPOSITION:  PACU - hemodynamically stable.   Delay start of Pharmacological VTE agent (>24hrs) due to surgical blood loss or risk of bleeding: not applicable

## 2014-06-29 NOTE — Progress Notes (Signed)
Dr Martie Roundmilner at bedside d/c instructions given

## 2014-06-29 NOTE — Anesthesia Postprocedure Evaluation (Signed)
  Anesthesia Post-op Note  Patient: Corey Mckenzie  Procedure(s) Performed: Procedure(s) with comments: DENTAL RESTORATION/EXTRACTION WITH X-RAY (N/A) - Recall exam, full mouth x-rays and cleaning, two extractions and fillings  Patient Location: PACU  Anesthesia Type:General  Level of Consciousness: awake and alert   Airway and Oxygen Therapy: Patient Spontanous Breathing  Post-op Pain: none  Post-op Assessment: Post-op Vital signs reviewed, Patient's Cardiovascular Status Stable and Respiratory Function Stable  Post-op Vital Signs: Reviewed  Filed Vitals:   06/29/14 1430  BP: 112/74  Pulse: 94  Temp: 36.6 C  Resp: 22    Complications: No apparent anesthesia complications

## 2014-06-30 NOTE — Op Note (Signed)
NAMSherene Mckenzie:  Stallbaumer, [place]                   ACCOUNT NO.:  [id number]  MEDICAL RECORD NO.:  [id number]  LOCATION:  [facility]                         FACILITY:  [facility]  PHYSICIAN:  Esaw DaceWilliam E. 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  Dental care provided in the OR for medically necessary treatment.  OPERATION:  Full-mouth oral rehabilitation including exam, x-rays, cleaning, and extractions.  OPERATIVE SURGEON:  Azucena FreedWilliam E. Ekaterini Capitano, DDS  ASSISTANT:  Laqueta CarinaMary Elizabeth Andreyev and hospital staff.  ANESTHESIA:  General.  PROCEDURE IN DETAIL:  The patient was brought into the operating room, placed in supine position.  General anesthesia was administered via nasal intubation.  The patient was prepped and draped in the usual manner for an intraoral general dentistry procedure.  The oropharynx was suctioned and a moistened posterior throat pack was placed.  A full intraoral exam, including all hard and soft tissues was performed.  This was a recall exam.  The soft tissue exam reveals the floor of the mouth, buccal mucosa, soft palate, hard palate, tongue, gingival and frenum attachments, all within normal limits with regard to size, color, and consistency.  Hard tissue exam reveals tooth #1 and #2 missing, 3 through 15 present, 16 through 19 missing, 20 through 31 present, and #32 missing.  Full-mouth series of digital x-rays was taken and full mouth debridement was completed.  Operative care was provided with a high-speed handpiece #57 bur, #4 bur, and a slow speed handpiece #4 bur. Amalgam was completed on #14 MODL. Composites were completed on #5 DF, 50F, 12F, 59F, 30OF.  A glass ionomer was completed on #64F.  Extractions were completed on tooth #13 and 15.  Gelfoam was inserted in the extraction  sites, 3-0 chromic suture was used to close the extraction sites.  A 5 mL of 2% lidocaine with 1:100,000 epinephrine was given. The estimated blood loss was 15 mL.  Mouth was suctioned dry and a posterior throat pack was carefully removed with constant suction.  The patient was awakened in the OR, and transferred to the recovery room in good condition.  An extraction sheet was given to the mother at post surgery.  DRUGS USED:  Norco 5 mg/325 mg 12 tablets 1 q.4 hours p.r.n. pain.     Esaw DaceWilliam E. Troyce Febo Jr., D.D.S.    WEM/MEDQ  D:  06/29/2014  T:  06/30/2014  Job:  [id number]

## 2014-07-03 ENCOUNTER — Encounter (HOSPITAL_COMMUNITY): Payer: Self-pay | Admitting: Dentistry

## 2017-01-07 ENCOUNTER — Encounter (HOSPITAL_COMMUNITY)
Admission: RE | Admit: 2017-01-07 | Discharge: 2017-01-07 | Disposition: A | Discharge status: Home / Self Care | Payer: Medicaid Other | Source: Ambulatory Visit | Attending: Dentistry | Admitting: Dentistry

## 2017-01-07 ENCOUNTER — Encounter (HOSPITAL_COMMUNITY): Payer: Self-pay

## 2017-01-07 DIAGNOSIS — K029 Dental caries, unspecified: Secondary | ICD-10-CM | POA: Insufficient documentation

## 2017-01-07 NOTE — Progress Notes (Signed)
Spoke with Corey HartPatrick at Dr Cathlean MarseillesMilners office informed him we need an H&P (within 30 days) and also that we do not have any orders. States he will check and call us back.

## 2017-01-07 NOTE — Progress Notes (Addendum)
PCP is Dr. [place] mother states he is going to see him tomorrow. Pt mother states we will need to draw labs while he is under anesthesia.  Mother Deneise informed if more labs are needed to have Dr. Juanita CraverGessemi fax over the order or bring the orders with her on the day of surgery. Voices understanding.   Mother denies pt ever seeing a cardiologist, or having any heart studies in the past. Pt has Autism and Mental retardation and is non verbal, his mother provides all information and answers all questions.  Revonda Standardllison called and informed.

## 2017-01-07 NOTE — Pre-Procedure Instructions (Signed)
Corey Mckenzie  01/07/2017      [facility] [id number] [place] - [address] AT [facility] [address] [place] [id number] Phone: [phone] Fax: [phone]    Your procedure is scheduled on .July 5  [facility] at 530 A.M.  Call this number if you have problems the morning of surgery:  [phone]   Remember:  Do not eat food or drink liquids after midnight.  Take these medicines the morning of surgery with A SIP OF WATER- NA  Stop taking aspirin, BC's, Goody's, Herbal medications, Fish Oil, Ibuprofen, Advil, Motrin, Aleve   Do not wear jewelry, make-up or nail polish.  Do not wear lotions, powders, or perfumes, or deoderant.  Do not shave 48 hours prior to surgery.  Men may shave face and neck.  Do not bring valuables to the hospital.  [facility] is not responsible for any belongings or valuables.  Contacts, dentures or bridgework may not be worn into surgery.  Leave your suitcase in the car.  After surgery it may be brought to your room.  For patients admitted to the hospital, discharge time will be determined by your treatment team.  Patients discharged the day of surgery will not be allowed to drive home.    Special instructions:  [facility] - Preparing for Surgery  Before surgery, you can play an important role.  Because skin is not sterile, your skin needs to be as free of germs as possible.  You can reduce the number of germs on you skin by washing with CHG (chlorahexidine gluconate) soap before surgery.  CHG is an antiseptic cleaner which kills germs and bonds with the skin to continue killing germs even after washing.  Please DO NOT use if you have an allergy to CHG or antibacterial soaps.  If your skin becomes reddened/irritated stop using the CHG and inform your nurse when you arrive at Short Stay.  Do not shave (including legs and underarms) for at least 48 hours prior to the first CHG shower.  You  may shave your face.  Please follow these instructions carefully:   1.  Shower with CHG Soap the night before surgery and the                                morning of Surgery.  2.  If you choose to wash your hair, wash your hair first as usual with your       normal shampoo.  3.  After you shampoo, rinse your hair and body thoroughly to remove the                      Shampoo.  4.  Use CHG as you would any other liquid soap.  You can apply chg directly       to the skin and wash gently with scrungie or a clean washcloth.  5.  Apply the CHG Soap to your body ONLY FROM THE NECK DOWN.        Do not use on open wounds or open sores.  Avoid contact with your eyes,       ears, mouth and genitals (private parts).  Wash genitals (private parts)       with your normal soap.  6.  Wash thoroughly, paying special attention to the area where your surgery  will be performed.  7.  Thoroughly rinse your body with warm water from the neck down.  8.  DO NOT shower/wash with your normal soap after using and rinsing off       the CHG Soap.  9.  Pat yourself dry with a clean towel.            10.  Wear clean pajamas.            11.  Place clean sheets on your bed the night of your first shower and do not        sleep with pets.  Day of Surgery  Do not apply any lotions/deoderants the morning of surgery.  Please wear clean clothes to the hospital/surgery center.     Please read over the following fact sheets that you were given. Pain Booklet, Coughing and Deep Breathing and Surgical Site Infection Prevention

## 2017-01-08 ENCOUNTER — Ambulatory Visit: Payer: Self-pay | Admitting: Dentistry
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
## 2017-01-14 ENCOUNTER — Encounter (HOSPITAL_COMMUNITY)
Admission: RE | Disposition: A | Discharge status: Home / Self Care | Payer: Self-pay | Source: Ambulatory Visit | Attending: Dentistry

## 2017-01-14 ENCOUNTER — Ambulatory Visit (HOSPITAL_COMMUNITY)
Admission: RE | Admit: 2017-01-14 | Discharge: 2017-01-14 | Disposition: A | Discharge status: Home / Self Care | Payer: Medicaid Other | Source: Ambulatory Visit | Attending: Dentistry | Admitting: Dentistry

## 2017-01-14 ENCOUNTER — Ambulatory Visit (HOSPITAL_COMMUNITY): Payer: Medicaid Other | Admitting: Vascular Surgery

## 2017-01-14 ENCOUNTER — Ambulatory Visit (HOSPITAL_COMMUNITY): Payer: Medicaid Other | Admitting: Certified Registered Nurse Anesthetist

## 2017-01-14 ENCOUNTER — Encounter (HOSPITAL_COMMUNITY): Payer: Self-pay | Admitting: Anesthesiology

## 2017-01-14 DIAGNOSIS — F79 Unspecified intellectual disabilities: Secondary | ICD-10-CM | POA: Insufficient documentation

## 2017-01-14 DIAGNOSIS — F840 Autistic disorder: Secondary | ICD-10-CM | POA: Insufficient documentation

## 2017-01-14 DIAGNOSIS — K029 Dental caries, unspecified: Secondary | ICD-10-CM | POA: Insufficient documentation

## 2017-01-14 DIAGNOSIS — Q02 Microcephaly: Secondary | ICD-10-CM | POA: Diagnosis not present

## 2017-01-14 DIAGNOSIS — G809 Cerebral palsy, unspecified: Secondary | ICD-10-CM | POA: Diagnosis not present

## 2017-01-14 HISTORY — PX: DENTAL RESTORATION/EXTRACTION WITH X-RAY: SHX5796

## 2017-01-14 LAB — COMPREHENSIVE METABOLIC PANEL
ALT: 12 U/L — ABNORMAL LOW (ref 17–63)
ANION GAP: 8 (ref 5–15)
AST: 24 U/L (ref 15–41)
Albumin: 3.7 g/dL (ref 3.5–5.0)
Alkaline Phosphatase: 72 U/L (ref 38–126)
BILIRUBIN TOTAL: 0.8 mg/dL (ref 0.3–1.2)
BUN: 13 mg/dL (ref 6–20)
CALCIUM: 9 mg/dL (ref 8.9–10.3)
CO2: 26 mmol/L (ref 22–32)
CREATININE: 0.9 mg/dL (ref 0.61–1.24)
Chloride: 103 mmol/L (ref 101–111)
GFR calc Af Amer: >60 mL/min (ref >60)
Glucose, Bld: 92 mg/dL (ref 65–99)
POTASSIUM: 4.1 mmol/L (ref 3.5–5.1)
Sodium: 137 mmol/L (ref 135–145)
Total Protein: 6.2 g/dL — ABNORMAL LOW (ref 6.5–8.1)

## 2017-01-14 LAB — CBC WITH DIFFERENTIAL/PLATELET
Basophils Absolute: 0 10*3/uL (ref 0.0–0.1)
Basophils Relative: 0 %
Eosinophils Absolute: 0.1 10*3/uL (ref 0.0–0.7)
Eosinophils Relative: 2 %
HEMATOCRIT: 45.2 % (ref 39.0–52.0)
Hemoglobin: 14.7 g/dL (ref 13.0–17.0)
LYMPHS ABS: 2.1 10*3/uL (ref 0.7–4.0)
LYMPHS PCT: 41 %
MCH: 28.8 pg (ref 26.0–34.0)
MCHC: 32.5 g/dL (ref 30.0–36.0)
MCV: 88.6 fL (ref 78.0–100.0)
MONO ABS: 0.3 10*3/uL (ref 0.1–1.0)
MONOS PCT: 7 %
NEUTROS ABS: 2.6 10*3/uL (ref 1.7–7.7)
Neutrophils Relative %: 50 %
Platelets: 292 10*3/uL (ref 150–400)
RBC: 5.1 MIL/uL (ref 4.22–5.81)
RDW: 13.9 % (ref 11.5–15.5)
WBC: 5.1 10*3/uL (ref 4.0–10.5)

## 2017-01-14 LAB — TSH: TSH: 1.088 u[IU]/mL (ref 0.350–4.500)

## 2017-01-14 LAB — LIPID PANEL
CHOL/HDL RATIO: 3.8 ratio
CHOLESTEROL: 241 mg/dL — AB (ref 0–200)
HDL: 63 mg/dL (ref >40)
LDL CALC: 161 mg/dL — AB (ref 0–99)
TRIGLYCERIDES: 85 mg/dL (ref <150)
VLDL: 17 mg/dL (ref 0–40)

## 2017-01-14 SURGERY — DENTAL RESTORATION/EXTRACTION WITH X-RAY
Anesthesia: General | Site: Mouth

## 2017-01-14 MED ORDER — ONDANSETRON HCL 4 MG/2ML IJ SOLN
INTRAMUSCULAR | Status: DC | PRN
  Administered 2017-01-14: 4 mg via INTRAVENOUS

## 2017-01-14 MED ORDER — LACTATED RINGERS IV SOLN
INTRAVENOUS | Status: DC | PRN
  Administered 2017-01-14: 08:00:00 via INTRAVENOUS

## 2017-01-14 MED ORDER — LIDOCAINE-EPINEPHRINE 2 %-1:100000 IJ SOLN
INTRAMUSCULAR | Status: AC
  Filled 2017-01-14: qty 1

## 2017-01-14 MED ORDER — PROPOFOL 10 MG/ML IV BOLUS
INTRAVENOUS | Status: AC
  Filled 2017-01-14: qty 60

## 2017-01-14 MED ORDER — PROPOFOL 10 MG/ML IV BOLUS
INTRAVENOUS | Status: DC | PRN
  Administered 2017-01-14: 10 mg via INTRAVENOUS
  Administered 2017-01-14: 100 mg via INTRAVENOUS
  Administered 2017-01-14: 10 mg via INTRAVENOUS

## 2017-01-14 MED ORDER — PHENYLEPHRINE 40 MCG/ML (10ML) SYRINGE FOR IV PUSH (FOR BLOOD PRESSURE SUPPORT)
PREFILLED_SYRINGE | INTRAVENOUS | Status: DC | PRN
  Administered 2017-01-14 (×3): 80 ug via INTRAVENOUS
  Administered 2017-01-14: 40 ug via INTRAVENOUS
  Administered 2017-01-14: 80 ug via INTRAVENOUS
  Administered 2017-01-14: 40 ug via INTRAVENOUS
  Administered 2017-01-14: 80 ug via INTRAVENOUS
  Administered 2017-01-14: 40 ug via INTRAVENOUS

## 2017-01-14 MED ORDER — SUCCINYLCHOLINE CHLORIDE 200 MG/10ML IV SOSY
PREFILLED_SYRINGE | INTRAVENOUS | Status: DC | PRN
  Administered 2017-01-14: 80 mg via INTRAVENOUS

## 2017-01-14 MED ORDER — EPHEDRINE SULFATE 50 MG/ML IJ SOLN
INTRAMUSCULAR | Status: DC | PRN
  Administered 2017-01-14: 5 mg via INTRAVENOUS

## 2017-01-14 MED ORDER — LIDOCAINE-EPINEPHRINE 2 %-1:100000 IJ SOLN
INTRAMUSCULAR | Status: DC | PRN
  Administered 2017-01-14: 6 mg via INTRADERMAL

## 2017-01-14 MED ORDER — CHLORHEXIDINE GLUCONATE 0.12 % MT SOLN
15.0000 mL | OROMUCOSAL | Status: AC
  Administered 2017-01-14: 15 mL via OROMUCOSAL
  Filled 2017-01-14: qty 15

## 2017-01-14 MED ORDER — FENTANYL CITRATE (PF) 250 MCG/5ML IJ SOLN
INTRAMUSCULAR | Status: AC
  Filled 2017-01-14: qty 5

## 2017-01-14 MED ORDER — LIDOCAINE 2% (20 MG/ML) 5 ML SYRINGE
INTRAMUSCULAR | Status: DC | PRN
  Administered 2017-01-14: 40 mg via INTRAVENOUS

## 2017-01-14 MED ORDER — MIDAZOLAM HCL 2 MG/2ML IJ SOLN
INTRAMUSCULAR | Status: AC
  Filled 2017-01-14: qty 2

## 2017-01-14 MED ORDER — MIDAZOLAM HCL 2 MG/ML PO SYRP
12.0000 mg | ORAL_SOLUTION | Freq: Once | ORAL | Status: AC
  Administered 2017-01-14: 12 mg via ORAL
  Filled 2017-01-14: qty 6

## 2017-01-14 SURGICAL SUPPLY — 28 items
CANISTER SUCT 3000ML PPV (MISCELLANEOUS) ×3 IMPLANT
COVER BACK TABLE 60X90IN (DRAPES) ×3 IMPLANT
COVER PROBE W GEL 5X96 (DRAPES) ×3 IMPLANT
COVER SURGICAL LIGHT HANDLE (MISCELLANEOUS) IMPLANT
DRAPE HALF SHEET 40X57 (DRAPES) ×3 IMPLANT
ELECT BLADE 4.0 EZ CLEAN MEGAD (MISCELLANEOUS) ×3
ELECTRODE BLDE 4.0 EZ CLN MEGD (MISCELLANEOUS) ×1 IMPLANT
GAUZE SPONGE 4X4 16PLY XRAY LF (GAUZE/BANDAGES/DRESSINGS) ×3 IMPLANT
GLOVE BIO SURGEON STRL SZ7.5 (GLOVE) ×3 IMPLANT
GLOVE BIOGEL PI IND STRL 6.5 (GLOVE) ×1 IMPLANT
GLOVE BIOGEL PI INDICATOR 6.5 (GLOVE) ×2
GOWN STRL REUS W/ TWL LRG LVL3 (GOWN DISPOSABLE) ×2 IMPLANT
GOWN STRL REUS W/TWL LRG LVL3 (GOWN DISPOSABLE) ×4
KIT BASIN OR (CUSTOM PROCEDURE TRAY) ×3 IMPLANT
KIT ROOM TURNOVER OR (KITS) ×3 IMPLANT
NEEDLE 25GX 5/8IN NON SAFETY (NEEDLE) ×3 IMPLANT
NEEDLE PRECISIONGLIDE 27X1.5 (NEEDLE) ×6 IMPLANT
PAD ARMBOARD 7.5X6 YLW CONV (MISCELLANEOUS) ×6 IMPLANT
PENCIL BUTTON HOLSTER BLD 10FT (ELECTRODE) ×3 IMPLANT
SPONGE SURGIFOAM ABS GEL SZ50 (HEMOSTASIS) ×3 IMPLANT
SUT CHROMIC 3 0 PS 2 (SUTURE) ×6 IMPLANT
SYR CONTROL 10ML LL (SYRINGE) ×3 IMPLANT
TOOTHBRUSH ADULT (PERSONAL CARE ITEMS) ×3 IMPLANT
TOWEL OR 17X26 10 PK STRL BLUE (TOWEL DISPOSABLE) ×3 IMPLANT
TUBE CONNECTING 12'X1/4 (SUCTIONS) ×1
TUBE CONNECTING 12X1/4 (SUCTIONS) ×2 IMPLANT
WATER TABLETS ICX (MISCELLANEOUS) ×3 IMPLANT
YANKAUER SUCT BULB TIP NO VENT (SUCTIONS) ×3 IMPLANT

## 2017-01-14 NOTE — Anesthesia Preprocedure Evaluation (Addendum)
Anesthesia Evaluation  Patient identified by MRN, date of birth, ID band Patient awake    Reviewed: Allergy & Precautions, NPO status , Patient's Chart, lab work & pertinent test results  Airway Mallampati: III  TM Distance: >3 FB Neck ROM: Full    Dental  (+) Poor Dentition   Pulmonary pneumonia, resolved,    Pulmonary exam normal breath sounds clear to auscultation       Cardiovascular negative cardio ROS Normal cardiovascular exam Rhythm:Regular Rate:Normal     Neuro/Psych Seizures -, Well Controlled,  PSYCHIATRIC DISORDERS AutismMicrocephaly Mental retardation Hx/o heat stroke    GI/Hepatic Neg liver ROS, Dental caries   Endo/Other  PCM  Renal/GU negative Renal ROS  negative genitourinary   Musculoskeletal negative musculoskeletal ROS (+)   Abdominal   Peds  Hematology negative hematology ROS (+)   Anesthesia Other Findings   Reproductive/Obstetrics                             Anesthesia Physical Anesthesia Plan  ASA: III  Anesthesia Plan: General   Post-op Pain Management:    Induction: Inhalational  PONV Risk Score and Plan: 3 and Ondansetron, Propofol, Midazolam and Treatment may vary due to age or medical condition  Airway Management Planned: Nasal ETT and Video Laryngoscope Planned  Additional Equipment:   Intra-op Plan:   Post-operative Plan: Extubation in OR  Informed Consent: I have reviewed the patients History and Physical, chart, labs and discussed the procedure including the risks, benefits and alternatives for the proposed anesthesia with the patient or authorized representative who has indicated his/her understanding and acceptance.   Dental advisory given  Plan Discussed with: CRNA, Anesthesiologist and Surgeon  Anesthesia Plan Comments:         Anesthesia Quick Evaluation

## 2017-01-14 NOTE — Anesthesia Postprocedure Evaluation (Signed)
Anesthesia Post Note  Patient: Corey Mckenzie  Procedure(s) Performed: Procedure(s) (LRB): X-RAY and cleaning of Teeth, Extraction of # 5, Dental Restoration of 4 DO, 5 EXN, 10F, 3L,27 F,  31 DFL, 30 DO,43F (N/A)     Patient location during evaluation: PACU Anesthesia Type: General Level of consciousness: awake and alert Pain management: pain level controlled Vital Signs Assessment: post-procedure vital signs reviewed and stable Respiratory status: spontaneous breathing, nonlabored ventilation and respiratory function stable Cardiovascular status: blood pressure returned to baseline and stable Postop Assessment: no signs of nausea or vomiting Anesthetic complications: no    Last Vitals:  Vitals:   01/14/17 1005 01/14/17 1014  BP: 118/85 120/84  Pulse: 97 92  Resp: 20 20  Temp:  36.4 C    Last Pain:  Vitals:   01/14/17 0951  TempSrc:   PainSc: Asleep                 Aniyiah Zell A.

## 2017-01-14 NOTE — Op Note (Signed)
#  3 - Coronal, smooth #4 - Coronal, chewing, smooth #6 - Coronal, smooth #21 - Coronal, smooth #27 - Coronal, smooth #30 - Coronal, chewing, smooth #31 - Coronal, smooth  #31 (MFDL) not included in previous op notes - Coronal, smooth

## 2017-01-14 NOTE — Op Note (Signed)
Recall Exam, Full Mouth Debridement, Full Mouth X-rays #5 Surgical Extraction with gel foam/3-0 chromic suture closure Extraction sheet signed by mother. Glass Ionomers - #3 L, 4 DO, 6 F, 21 F, 27 F, 30 DO 6 cc 2% Lidocaine No complications.

## 2017-01-14 NOTE — Anesthesia Procedure Notes (Signed)
Procedure Name: Intubation Date/Time: 01/14/2017 7:47 AM Performed by: Daiva EvesAVENEL, Sunaina Ferrando W Pre-anesthesia Checklist: Patient identified, Emergency Drugs available, Suction available, Patient being monitored and Timeout performed Patient Re-evaluated:Patient Re-evaluated prior to inductionOxygen Delivery Method: Circle system utilized Preoxygenation: Pre-oxygenation with 100% oxygen Intubation Type: IV induction and Inhalational induction Ventilation: Mask ventilation without difficulty Laryngoscope Size: Glidescope and 4 Grade View: Grade I Nasal Tubes: Nasal Rae Tube size: 6.5 mm Number of attempts: 1 Placement Confirmation: ETT inserted through vocal cords under direct vision,  CO2 detector and breath sounds checked- equal and bilateral Tube secured with: Tape Dental Injury: Teeth and Oropharynx as per pre-operative assessment

## 2017-01-14 NOTE — H&P (Signed)
H&P reviewed, no changes in assessment  

## 2017-01-14 NOTE — Transfer of Care (Signed)
Immediate Anesthesia Transfer of Care Note  Patient: Amair NestJason A Corporan  Procedure(s) Performed: Procedure(s): X-RAY and cleaning of Teeth, Extraction of # 5, Dental Restoration of 4 DO, 5 EXN, 68F, 3L,27 F,  31 DFL, 30 DO,1F (N/A)  Patient Location: PACU  Anesthesia Type:General  Level of Consciousness: awake, alert  and patient cooperative  Airway & Oxygen Therapy: Patient Spontanous Breathing  Post-op Assessment: Report given to RN and Post -op Vital signs reviewed and stable  Post vital signs: Reviewed and stable  Last Vitals:  Vitals:   01/14/17 0611 01/14/17 0951  BP: 107/73 (P) 114/73  Pulse: 73 88  Resp: 14 (P) 20  Temp: 36.5 C (!) 36.4 C    Last Pain:  Vitals:   01/14/17 0951  TempSrc:   PainSc: (P) Asleep      Patients Stated Pain Goal: 3 (01/14/17 [id number])  Complications: No apparent anesthesia complications

## 2017-01-14 NOTE — Anesthesia Procedure Notes (Signed)
Procedure Name: Intubation Date/Time: 01/14/2017 7:43 AM Performed by: Daiva EvesAVENEL, Fenix Rorke W Pre-anesthesia Checklist: Patient identified, Emergency Drugs available, Patient being monitored, Suction available and Timeout performed Patient Re-evaluated:Patient Re-evaluated prior to inductionOxygen Delivery Method: Circle system utilized Preoxygenation: Pre-oxygenation with 100% oxygen Intubation Type: Combination inhalational/ intravenous induction Ventilation: Mask ventilation without difficulty Laryngoscope Size: Glidescope and 3 Grade View: Grade I Tube type: Oral Nasal Tubes: Left Tube size: 6.5 mm Number of attempts: 1 Placement Confirmation: ETT inserted through vocal cords under direct vision and breath sounds checked- equal and bilateral Secured at: 25 cm Tube secured with: Tape Dental Injury: Teeth and Oropharynx as per pre-operative assessment

## 2017-01-14 NOTE — Brief Op Note (Signed)
01/14/2017  10:09 AM  PATIENT:  Corey Mckenzie  31 y.o. male  PRE-OPERATIVE DIAGNOSIS:  dental carries  POST-OPERATIVE DIAGNOSIS:  dental carries  PROCEDURE:  Procedure(s): X-RAY and cleaning of Teeth, Extraction of # 5, Dental Restoration of 4 DO, 5 EXN, 63F, 3L,27 F,  31 DFL, 30 DO,72F (N/A)  SURGEON:  Surgeon(s) and Role:    * Boneta LucksMilner, [place], DDS - Primary  PHYSICIAN ASSISTANT:   ASSISTANTS: Alan RipperMary Elizabeth Andreyev, Jessica McDowell   ANESTHESIA:   general  EBL:  Total I/O In: 600 [I.V.:600] Out: 6 [Blood:6]  BLOOD ADMINISTERED:none  DRAINS: none   LOCAL MEDICATIONS USED:  LIDOCAINE   SPECIMEN:  No Specimen  DISPOSITION OF SPECIMEN:  N/A  COUNTS:  YES  TOURNIQUET:  * No tourniquets in log *  DICTATION: .Note written in EPIC  PLAN OF CARE: Discharge to home after PACU  PATIENT DISPOSITION:  PACU - hemodynamically stable.   Delay start of Pharmacological VTE agent (>24hrs) due to surgical blood loss or risk of bleeding: not applicable

## 2017-01-15 ENCOUNTER — Encounter (HOSPITAL_COMMUNITY): Payer: Self-pay | Admitting: Dentistry

## 2017-01-15 NOTE — Op Note (Signed)
NAME:  Corey Mckenzie, Corey Mckenzie                   ACCOUNT NO.:  [id number]  MEDICAL RECORD NO.:  Sherene [id number]  LOCATION:                                 FACILITY:  PHYSICIAN:  Esaw DaceWilliam E. Peirce Deveney Jr., D.D.S.DATE OF BIRTH:  DATE OF PROCEDURE:  01/14/2017 DATE OF DISCHARGE:                              OPERATIVE REPORT   PREOPERATIVE DIAGNOSIS:  Dental caries/behavioral management issues due to intellectual/developmental disabilities.  Dental care provided in the OR for medically necessary treatment.  OPERATION:  Full mouth oral rehabilitation including exam, x-rays, cleaning, and extractions.  ASSISTANTS:  Laqueta CarinaMary Elizabeth Andreyev and Ruben GottronJessica McDowell.  DESCRIPTION OF PROCEDURE:  The patient was brought into the operating room and placed in the supine position.  General anesthesia was administered via nasal intubation.  The patient was prepped and draped in usual manner for an intraoral general dentistry procedure.  The oropharynx was suctioned and a moistened posterior throat pack was placed.  A full intraoral exam including all hard and soft tissues was performed.  This was a recall exam.  Soft tissue exam reveals the floor of the mouth, buccal mucosa, soft palate, hard palate, tongue, gingival and frenum attachments all within normal limits with regard to size, color, and consistency.  A full-mouth series of digital x-rays were taken and full-mouth debridement was completed.  Operative care was provided with a high-speed handpiece #557 and #4 bur, and a slow-speed handpiece #4 bur.  Glass ionomers were completed on #3 L, 4 O, 6 F, 21 F, 27 F, 30 DO, 31 DFL.  Extraction was completed on #5.  Gel-Foam inserted into the extraction site with closure of 3-0 chromic suture, 6 mL of 2% lidocaine with 1:100,000 epinephrine was given and estimated blood loss was 20 mL.  The mouth was suctioned dry and a posterior throat pack was carefully removed with constant suction.  The patient was awakened in the OR  and transferred to the recovery room in good condition.  An extraction sheet was signed by the patient's mother and an explanation work done was given to the patient's mother and brother. There were no complications.     Esaw DaceWilliam E. Pranavi Aure Jr., D.D.S.   ______________________________ Esaw DaceWilliam E. Arthella Headings Jr., D.D.S.    WEM/MEDQ  D:  01/14/2017  T:  01/15/2017  Job:  [id number]

## 2017-01-21 NOTE — H&P (Signed)
H&P reviewed, no changes in assessment  

## 2018-11-01 ENCOUNTER — Ambulatory Visit: Payer: Self-pay | Admitting: Dentistry

## 2018-11-02 ENCOUNTER — Other Ambulatory Visit: Payer: Self-pay

## 2018-11-02 ENCOUNTER — Encounter (HOSPITAL_COMMUNITY): Payer: Self-pay | Admitting: *Deleted

## 2018-11-02 NOTE — Progress Notes (Signed)
SDW-pre-op call completed by pt mother ( guardian), Deneise. Mother denies that pt has SOB and chest pain. Mother denies that pt is under the care of a cardiologist. Mother denies that pt had a stress test, echo and cardiac cath. Mother denies that pt had a chest x ray and EKG within the ;last year. Mother denies recent labs. Mother made aware to stop administering Aspirin (unless otherwise advised by surgeon), vitamins, fish oil, Black Elderberry and herbal medications. Do not take any NSAIDs ie: Ibuprofen, Advil, Naproxen (Aleve), Motrin, BC and Goody Powder. Pt mother stated that pt father traveled to [place] > 30 days ago. Mother denies that pt and other family members tested positive for COVID-19.  Mother denies that pt and family members have you experienced the following symptoms:  Cough yes/no: No Fever (>100.32F)  yes/no: No Runny nose yes/no: No Sore throat yes/no: No Difficulty breathing/shortness of breath  yes/no: No  Have you or a family member traveled in the last 14 days and where? yes/no: No Pt mother reminded that hospital visitation restrictions are in effect and the importance of the restrictions ( will wait in car during procedure; needs to come in with disabled pt). Pt mother verbalized understanding of all pre-op instructions.

## 2018-11-02 NOTE — Anesthesia Preprocedure Evaluation (Addendum)
Anesthesia Evaluation  Patient identified by MRN, date of birth, ID band Patient awake    Reviewed: Allergy & Precautions, NPO status   Airway Mallampati: II     Mouth opening: Limited Mouth Opening  Dental   Pulmonary pneumonia,    breath sounds clear to auscultation       Cardiovascular negative cardio ROS   Rhythm:Regular Rate:Normal     Neuro/Psych Seizures -,  PSYCHIATRIC DISORDERS History noted CG   GI/Hepatic negative GI ROS, Neg liver ROS,   Endo/Other  negative endocrine ROS  Renal/GU negative Renal ROS     Musculoskeletal   Abdominal   Peds  Hematology   Anesthesia Other Findings   Reproductive/Obstetrics                            Anesthesia Physical Anesthesia Plan  ASA: III  Anesthesia Plan: General   Post-op Pain Management:    Induction: Intravenous  PONV Risk Score and Plan: Ondansetron and Midazolam  Airway Management Planned: Oral ETT  Additional Equipment:   Intra-op Plan:   Post-operative Plan: Extubation in OR  Informed Consent: I have reviewed the patients History and Physical, chart, labs and discussed the procedure including the risks, benefits and alternatives for the proposed anesthesia with the patient or authorized representative who has indicated his/her understanding and acceptance.     Dental advisory given  Plan Discussed with: Anesthesiologist and CRNA  Anesthesia Plan Comments:        Anesthesia Quick Evaluation

## 2018-11-03 ENCOUNTER — Ambulatory Visit (HOSPITAL_COMMUNITY)
Admission: RE | Admit: 2018-11-03 | Discharge: 2018-11-03 | Disposition: A | Discharge status: Home / Self Care | Payer: Medicaid Other | Attending: Dentistry | Admitting: Dentistry

## 2018-11-03 ENCOUNTER — Other Ambulatory Visit: Payer: Self-pay

## 2018-11-03 ENCOUNTER — Encounter (HOSPITAL_COMMUNITY)
Admission: RE | Disposition: A | Discharge status: Home / Self Care | Payer: Self-pay | Source: Home / Self Care | Attending: Dentistry

## 2018-11-03 ENCOUNTER — Encounter (HOSPITAL_COMMUNITY): Payer: Self-pay

## 2018-11-03 ENCOUNTER — Ambulatory Visit (HOSPITAL_COMMUNITY): Payer: Medicaid Other | Admitting: Anesthesiology

## 2018-11-03 DIAGNOSIS — F840 Autistic disorder: Secondary | ICD-10-CM | POA: Diagnosis not present

## 2018-11-03 DIAGNOSIS — K029 Dental caries, unspecified: Secondary | ICD-10-CM | POA: Diagnosis present

## 2018-11-03 DIAGNOSIS — G809 Cerebral palsy, unspecified: Secondary | ICD-10-CM | POA: Diagnosis not present

## 2018-11-03 HISTORY — PX: TOOTH EXTRACTION: SHX859

## 2018-11-03 LAB — LIPID PANEL
Cholesterol: 166 mg/dL (ref 0–200)
HDL: 66 mg/dL (ref >40)
LDL Cholesterol: 68 mg/dL (ref 0–99)
Total CHOL/HDL Ratio: 2.5 RATIO
Triglycerides: 158 mg/dL — ABNORMAL HIGH (ref <150)
VLDL: 32 mg/dL (ref 0–40)

## 2018-11-03 LAB — CBC
HCT: 46.4 % (ref 39.0–52.0)
Hemoglobin: 15 g/dL (ref 13.0–17.0)
MCH: 29.5 pg (ref 26.0–34.0)
MCHC: 32.3 g/dL (ref 30.0–36.0)
MCV: 91.3 fL (ref 80.0–100.0)
Platelets: 222 10*3/uL (ref 150–400)
RBC: 5.08 MIL/uL (ref 4.22–5.81)
RDW: 13.5 % (ref 11.5–15.5)
WBC: 5.5 10*3/uL (ref 4.0–10.5)
nRBC: 0 % (ref 0.0–0.2)

## 2018-11-03 LAB — HEMOGLOBIN A1C
Hgb A1c MFr Bld: 5.9 % — ABNORMAL HIGH (ref 4.8–5.6)
Mean Plasma Glucose: 122.63 mg/dL

## 2018-11-03 LAB — TSH: TSH: 2.236 u[IU]/mL (ref 0.350–4.500)

## 2018-11-03 SURGERY — DENTAL RESTORATION/EXTRACTIONS
Anesthesia: General | Site: Mouth

## 2018-11-03 MED ORDER — PHENYLEPHRINE 40 MCG/ML (10ML) SYRINGE FOR IV PUSH (FOR BLOOD PRESSURE SUPPORT)
PREFILLED_SYRINGE | INTRAVENOUS | Status: DC | PRN
  Administered 2018-11-03 (×2): 40 ug via INTRAVENOUS
  Administered 2018-11-03: 120 ug via INTRAVENOUS
  Administered 2018-11-03: 40 ug via INTRAVENOUS

## 2018-11-03 MED ORDER — MIDAZOLAM HCL 2 MG/2ML IJ SOLN
INTRAMUSCULAR | Status: AC
  Filled 2018-11-03: qty 2

## 2018-11-03 MED ORDER — MIDAZOLAM HCL 2 MG/2ML IJ SOLN
INTRAMUSCULAR | Status: DC | PRN
  Administered 2018-11-03: 2 mg via INTRAVENOUS

## 2018-11-03 MED ORDER — LIDOCAINE-EPINEPHRINE 2 %-1:100000 IJ SOLN
INTRAMUSCULAR | Status: DC | PRN
  Administered 2018-11-03: 3 mL via INTRADERMAL

## 2018-11-03 MED ORDER — SUCCINYLCHOLINE CHLORIDE 200 MG/10ML IV SOSY
PREFILLED_SYRINGE | INTRAVENOUS | Status: DC | PRN
  Administered 2018-11-03: 100 mg via INTRAVENOUS

## 2018-11-03 MED ORDER — MIDAZOLAM HCL 2 MG/ML PO SYRP
12.0000 mg | ORAL_SOLUTION | Freq: Once | ORAL | Status: AC
  Administered 2018-11-03: 07:00:00 12 mg via ORAL

## 2018-11-03 MED ORDER — DEXAMETHASONE SODIUM PHOSPHATE 10 MG/ML IJ SOLN
INTRAMUSCULAR | Status: DC | PRN
  Administered 2018-11-03: 5 mg via INTRAVENOUS

## 2018-11-03 MED ORDER — FENTANYL CITRATE (PF) 250 MCG/5ML IJ SOLN
INTRAMUSCULAR | Status: DC | PRN
  Administered 2018-11-03: 50 ug via INTRAVENOUS
  Administered 2018-11-03 (×3): 25 ug via INTRAVENOUS

## 2018-11-03 MED ORDER — PROPOFOL 10 MG/ML IV BOLUS
INTRAVENOUS | Status: DC | PRN
  Administered 2018-11-03: 130 mg via INTRAVENOUS

## 2018-11-03 MED ORDER — PROPOFOL 10 MG/ML IV BOLUS
INTRAVENOUS | Status: AC
  Filled 2018-11-03: qty 20

## 2018-11-03 MED ORDER — FENTANYL CITRATE (PF) 100 MCG/2ML IJ SOLN: 25.0000 ug | INTRAMUSCULAR | Status: DC | PRN

## 2018-11-03 MED ORDER — ONDANSETRON HCL 4 MG/2ML IJ SOLN
INTRAMUSCULAR | Status: DC | PRN
  Administered 2018-11-03: 4 mg via INTRAVENOUS

## 2018-11-03 MED ORDER — FENTANYL CITRATE (PF) 250 MCG/5ML IJ SOLN
INTRAMUSCULAR | Status: AC
  Filled 2018-11-03: qty 5

## 2018-11-03 MED ORDER — MIDAZOLAM HCL 2 MG/ML PO SYRP
ORAL_SOLUTION | ORAL | Status: AC
  Filled 2018-11-03: qty 6

## 2018-11-03 MED ORDER — HEMOSTATIC AGENTS (NO CHARGE) OPTIME
TOPICAL | Status: DC | PRN
  Administered 2018-11-03: 1 via TOPICAL

## 2018-11-03 MED ORDER — LIDOCAINE-EPINEPHRINE 2 %-1:100000 IJ SOLN
INTRAMUSCULAR | Status: AC
  Filled 2018-11-03: qty 1

## 2018-11-03 MED ORDER — LACTATED RINGERS IV SOLN
INTRAVENOUS | Status: DC | PRN
  Administered 2018-11-03: 08:00:00 via INTRAVENOUS

## 2018-11-03 SURGICAL SUPPLY — 44 items
BLADE 10 SAFETY STRL DISP (BLADE) ×1 IMPLANT
BLADE SURG 15 STRL LF DISP TIS (BLADE) IMPLANT
BLADE SURG 15 STRL SS (BLADE)
CANISTER SUCT 3000ML PPV (MISCELLANEOUS) ×3 IMPLANT
CONT SPEC 4OZ CLIKSEAL STRL BL (MISCELLANEOUS) IMPLANT
COVER BACK TABLE 60X90IN (DRAPES) ×3 IMPLANT
COVER PROBE W GEL 5X96 (DRAPES) ×3 IMPLANT
COVER SURGICAL LIGHT HANDLE (MISCELLANEOUS) ×3 IMPLANT
COVER WAND RF STERILE (DRAPES) ×1 IMPLANT
DECANTER SPIKE VIAL GLASS SM (MISCELLANEOUS) ×2 IMPLANT
DRAPE HALF SHEET 40X57 (DRAPES) ×3 IMPLANT
ELECT COATED BLADE 2.86 ST (ELECTRODE) IMPLANT
ELECT REM PT RETURN 9FT ADLT (ELECTROSURGICAL)
ELECTRODE REM PT RTRN 9FT ADLT (ELECTROSURGICAL) IMPLANT
GAUZE 4X4 16PLY RFD (DISPOSABLE) ×3 IMPLANT
GAUZE PACKING FOLDED 2  STR (GAUZE/BANDAGES/DRESSINGS) ×2
GAUZE PACKING FOLDED 2 STR (GAUZE/BANDAGES/DRESSINGS) ×1 IMPLANT
GAUZE SPONGE 2X2 8PLY STRL LF (GAUZE/BANDAGES/DRESSINGS) IMPLANT
GAUZE SPONGE 4X4 12PLY STRL (GAUZE/BANDAGES/DRESSINGS) ×3 IMPLANT
GLOVE BIO SURGEON STRL SZ7.5 (GLOVE) ×3 IMPLANT
GOWN STRL REUS W/ TWL LRG LVL3 (GOWN DISPOSABLE) ×2 IMPLANT
GOWN STRL REUS W/TWL LRG LVL3 (GOWN DISPOSABLE) ×4
KIT BASIN OR (CUSTOM PROCEDURE TRAY) ×3 IMPLANT
KIT TURNOVER KIT B (KITS) ×3 IMPLANT
NDL FILTER BLUNT 18X1 1/2 (NEEDLE) IMPLANT
NDL PRECISIONGLIDE 27X1.5 (NEEDLE) IMPLANT
NEEDLE FILTER BLUNT 18X 1/2SAF (NEEDLE)
NEEDLE FILTER BLUNT 18X1 1/2 (NEEDLE) IMPLANT
NEEDLE PRECISIONGLIDE 27X1.5 (NEEDLE) ×3 IMPLANT
PAD ARMBOARD 7.5X6 YLW CONV (MISCELLANEOUS) ×6 IMPLANT
PENCIL BUTTON HOLSTER BLD 10FT (ELECTRODE) IMPLANT
SPONGE GAUZE 2X2 STER 10/PKG (GAUZE/BANDAGES/DRESSINGS)
SPONGE SURGIFOAM ABS GEL 12-7 (HEMOSTASIS) ×2 IMPLANT
SPONGE SURGIFOAM ABS GEL SZ50 (HEMOSTASIS) IMPLANT
SUT CHROMIC 3 0 PS 2 (SUTURE) ×2 IMPLANT
SYR CONTROL 10ML LL (SYRINGE) ×2 IMPLANT
TOOTHBRUSH ADULT (PERSONAL CARE ITEMS) ×2 IMPLANT
TOWEL OR 17X24 6PK STRL BLUE (TOWEL DISPOSABLE) IMPLANT
TOWEL OR 17X26 10 PK STRL BLUE (TOWEL DISPOSABLE) ×3 IMPLANT
TUBE CONNECTING 12'X1/4 (SUCTIONS) ×2
TUBE CONNECTING 12X1/4 (SUCTIONS) ×3 IMPLANT
WATER STERILE IRR 1000ML POUR (IV SOLUTION) ×6 IMPLANT
WATER TABLETS ICX (MISCELLANEOUS) ×3 IMPLANT
YANKAUER SUCT BULB TIP NO VENT (SUCTIONS) ×5 IMPLANT

## 2018-11-03 NOTE — Anesthesia Postprocedure Evaluation (Signed)
Anesthesia Post Note  Patient: Corey Mckenzie  Procedure(s) Performed: full mouth DENTAL XRAYS, dental exam, CLEANING,  EXTRACTION #6, full mouth debridement, glass ionomers #3 (mol), #21 (f), #30 (f), #31(f) (N/A Mouth)     Patient location during evaluation: PACU Anesthesia Type: General Level of consciousness: awake Pain management: pain level controlled Vital Signs Assessment: post-procedure vital signs reviewed and stable Respiratory status: spontaneous breathing Cardiovascular status: stable Postop Assessment: no apparent nausea or vomiting Anesthetic complications: no    Last Vitals:  Vitals:   11/03/18 0945 11/03/18 1015  BP: 111/72 98/62  Pulse: 92 90  Resp: 16 17  Temp: (!) 36.2 C   SpO2: 100% 100%    Last Pain:  Vitals:   11/03/18 0919  TempSrc:   PainSc: Asleep                 Paris Hohn

## 2018-11-03 NOTE — Anesthesia Procedure Notes (Addendum)
Procedure Name: Intubation Date/Time: 11/03/2018 7:49 AM Performed by: Elayne Snare, CRNA Pre-anesthesia Checklist: Patient identified, Emergency Drugs available, Suction available and Patient being monitored Patient Re-evaluated:Patient Re-evaluated prior to induction Oxygen Delivery Method: Circle System Utilized Preoxygenation: Pre-oxygenation with 100% oxygen Induction Type: IV induction and Combination inhalational/ intravenous induction Laryngoscope Size: 4 and Mac Grade View: Grade I Tube type: Oral Tube size: 7.0 mm Number of attempts: 1 Airway Equipment and Method: Stylet and Oral airway Placement Confirmation: ETT inserted through vocal cords under direct vision,  positive ETCO2 and breath sounds checked- equal and bilateral Secured at: 21 cm Tube secured with: Tape Dental Injury: Teeth and Oropharynx as per pre-operative assessment  Comments: Patient received oral versed prior to going to back OR; patient relaxed. Patient combative when restricted to obtain IV access. Blow-by inhalational nitrous used for IV placement.

## 2018-11-03 NOTE — Transfer of Care (Signed)
Immediate Anesthesia Transfer of Care Note  Patient: Corey Mckenzie  Procedure(s) Performed: full mouth DENTAL XRAYS, dental exam, CLEANING,  EXTRACTION #6, full mouth debridement, glass ionomers #3 (mol), #21 (f), #30 (f), #31(f) (N/A Mouth)  Patient Location: PACU  Anesthesia Type:General  Level of Consciousness: drowsy and responds to stimulation  Airway & Oxygen Therapy: Patient Spontanous Breathing and Patient connected to face mask oxygen  Post-op Assessment: Report given to RN and Post -op Vital signs reviewed and stable  Post vital signs: Reviewed and stable  Last Vitals:  Vitals Value Taken Time  BP 100/67 11/03/2018  9:19 AM  Temp    Pulse 70 11/03/2018  9:22 AM  Resp 17 11/03/2018  9:22 AM  SpO2 100 % 11/03/2018  9:22 AM  Vitals shown include unvalidated device data.  Last Pain:  Vitals:   11/03/18 0919  TempSrc:   PainSc: (P) Asleep         Complications: No apparent anesthesia complications

## 2018-11-03 NOTE — Op Note (Signed)
NAMEDAVONTRE, Corey Mckenzie MEDICAL RECORD [id number] ACCOUNT [id number] DATE OF BIRTH:August 28, 1985 FACILITY: [facility] LOCATION: [facility] PHYSICIAN:Leisa Gault E. Ashley Royalty., DDS  OPERATIVE REPORT  DATE OF PROCEDURE:  11/03/2018  PREOPERATIVE DIAGNOSIS:  Dental caries/behavior management issues due to his intellectual/developmental disabilities.  Dental care provided in the OR for medically necessary treatment.  OPERATION:  Full mouth oral rehabilitation including exam, x-rays, cleanings, extractions and operative.  SURGEON:  Boneta Lucks, DDS  ASSISTANT:  Laqueta Carina and the hospital staff.  PROCEDURE:  The patient was brought into the operating room and placed in the supine position.  General anesthesia was administered via nasal intubation.  The patient was prepped and draped in the usual manner for an intraoral general dentistry  procedure.  The oropharynx was suctioned and a moistened posterior throat pack was placed.  A full intraoral exam including all hard and soft tissues was performed.  This is a recall exam.  Soft tissue exam reveals a floor of the mouth, buccal mucosa,  soft palate, hard palate, tongue, gingival and frenum attachments all within normal limits with regard to size, color and consistency.  Hard tissue exam reveals 1 missing, 2 through 4 present, 5 missing, 6 through 12 present, 13 missing, 14 and 15  present, 16 through 18 missing, 19 through 31 present, 32 missing.  Full mouth series of digital x-rays was taken.  A full mouth debridement was completed.  Oral operative care was provided with a high-speed handpiece with #557 and #4 and a slow speed  handpiece #4 burr.  Composites were completed on tooth 3 MOL coronal chewing smooth dentin, 40F coronal smooth, 32F coronal smooth, 43F coronal smooth.  Extraction was completed on 6.  Gelfoam was inserted into the extraction site and 3-0 chromic suture  was used to close the site.  Then, 3 mL 2% lidocaine with 1:100,000  epinephrine was given.  Estimated blood loss of 20 mL.  The mouth was suctioned dry and a posterior throat pack was carefully removed with constant suction.  The patient was awakened in  the OR and transferred to the recovery room in good condition.  TN/NUANCE  D:11/03/2018 T:11/03/2018 JOB:[id number]

## 2018-11-03 NOTE — H&P (Signed)
H&P reviewed, stable for surgery. Corey Mckenzie  

## 2018-11-04 ENCOUNTER — Encounter (HOSPITAL_COMMUNITY): Payer: Self-pay | Admitting: Dentistry

## 2019-06-26 ENCOUNTER — Other Ambulatory Visit: Payer: Self-pay

## 2019-06-26 DIAGNOSIS — Z20822 Contact with and (suspected) exposure to covid-19: Secondary | ICD-10-CM

## 2019-06-27 LAB — NOVEL CORONAVIRUS, NAA: SARS-CoV-2, NAA: NOT DETECTED

## 2021-11-13 ENCOUNTER — Ambulatory Visit: Payer: Self-pay | Admitting: Dentistry

## 2021-11-19 ENCOUNTER — Encounter (HOSPITAL_COMMUNITY): Payer: Self-pay | Admitting: *Deleted

## 2021-11-19 ENCOUNTER — Other Ambulatory Visit: Payer: Self-pay

## 2021-11-19 NOTE — Anesthesia Preprocedure Evaluation (Addendum)
Anesthesia Evaluation  ?Patient identified by MRN, date of birth, ID band ?Patient awake and Patient confused ? ? ? ?Reviewed: ?Allergy & Precautions, NPO status , Patient's Chart, lab work & pertinent test results ? ?History of Anesthesia Complications ?Negative for: history of anesthetic complications ? ?Airway ?Mallampati: Unable to assess ? ?TM Distance: >3 FB ?Neck ROM: Full ? ? ? Dental ? ?(+) Dental Advisory Given ?  ?Pulmonary ?neg pulmonary ROS,  ?  ?breath sounds clear to auscultation ? ? ? ? ? ? Cardiovascular ?negative cardio ROS ? ? ?Rhythm:Regular Rate:Normal ? ? ?  ?Neuro/Psych ?Seizures -, Well Controlled,  PSYCHIATRIC DISORDERS (Autism) Microcephaly ?Cerebral Palsy ?  ? GI/Hepatic ?negative GI ROS, Neg liver ROS,   ?Endo/Other  ?negative endocrine ROS ? Renal/GU ?negative Renal ROS  ? ?  ?Musculoskeletal ? ? Abdominal ?  ?Peds ? Hematology ?negative hematology ROS ?(+)   ?Anesthesia Other Findings ? ? Reproductive/Obstetrics ? ?  ? ? ? ? ? ? ? ? ? ? ? ? ? ?  ?  ? ? ? ? ? ? ? ?Anesthesia Physical ?Anesthesia Plan ? ?ASA: 3 ? ?Anesthesia Plan: General  ? ?Post-op Pain Management: Tylenol PO (pre-op)*  ? ?Induction: Inhalational ? ?PONV Risk Score and Plan: 2 and Dexamethasone and Ondansetron ? ?Airway Management Planned: Nasal ETT ? ?Additional Equipment: None ? ?Intra-op Plan:  ? ?Post-operative Plan: Extubation in OR ? ?Informed Consent: I have reviewed the patients History and Physical, chart, labs and discussed the procedure including the risks, benefits and alternatives for the proposed anesthesia with the patient or authorized representative who has indicated his/her understanding and acceptance.  ? ? ? ?Dental advisory given and Consent reviewed with POA ? ?Plan Discussed with: CRNA and Surgeon ? ?Anesthesia Plan Comments: (PAT note written 11/19/2021 by Shonna Chock, PA-C.  [facility] PCP Dr. Juanita Craver requested that labs be drawn while patient under anesthesia.  Dr. Rocky Morel faxed over lab requisitions for HgbA1c, Lipid Profile, Vitamin D, CBC with differential, TSH, CMP.)  ? ? ? ? ?Anesthesia Quick Evaluation ? ?

## 2021-11-19 NOTE — Progress Notes (Signed)
TWO VISITORS ARE ALLOWED TO COME WITH YOU AND STAY IN THE SURGICAL WAITING ROOM ONLY DURING PRE OP AND PROCEDURE DAY OF SURGERY.  ? ?PCP - Dr Patria Mane ?Cardiologist - n/a ? ?Chest x-ray - n/a ?EKG - n/a ?Stress Test - n/a ?ECHO - n/a ?Cardiac Cath - n/a ? ?ICD Pacemaker/Loop - n/a ? ?Sleep Study -  n/a ?CPAP - none ? ?Anesthesia review: Yes ? ?STOP now taking any Aspirin (unless otherwise instructed by your surgeon), Aleve, Naproxen, Ibuprofen, Motrin, Advil, Goody's, BC's, all herbal medications, fish oil, and all vitamins.  ? ?Coronavirus Screening ?Does the patient have any of the following symptoms:  ?Cough yes/no: No ?Fever (>100.68F)  yes/no: No ?Runny nose yes/no: No ?Sore throat yes/no: No ?Difficulty breathing/shortness of breath  yes/no: No ? ?Have you traveled in the last 14 days and where? yes/no: No ? ?Corey Mckenzie verbalized understanding of instructions that were given via phone. ?

## 2021-11-19 NOTE — Progress Notes (Signed)
Anesthesia Chart Review: SAME DAY WORK-UP ? Case: [id number] Date/Time: 11/20/21 0715  ? Procedure: DENTAL RESTORATION/EXTRACTION WITH X-RAY  ? Anesthesia type: General  ? Pre-op diagnosis: DENTAL CARIES  ? Location: [facility] OR ROOM 09 / [facility]  ? Surgeons: Janae Bridgeman, Corey Mckenzie  ? ?  ? ?  ?DISCUSSION: Patient is a 36 year old male scheduled for dental restoration/extraction with xray and cleaning on 65/11/23 by Corey Mckenzie, Corey Mckenzie.  H&P signed by Corey Salts, Corey Mckenzie on 11/03/21 (see [facility]). He wants labs done while patient is under anesthesia. ?  ?History includes autism, mental retardation, microcephaly, febrile seizures, dental surgery (last 11/03/18). Patient is non-verbal by PCP notes.  ? ?Notes indicate that he is not cooperative with lab draws, so he gets periodic lab draws when being sedated for procedures. Lab requisitions for HgbA1c, Lipid Profile, Vitamin D, CBC with differential, TSH, CMP faxed from Dr. Thana Ates office with copies of his H&P and consent form.  ?  ?Anesthesia team to evaluate on the day of surgery. ? ?VS: 11/03/21 [facility]: ?Blood Pressure 115/83 11/03/2021 3:04 PM EDT    ?Pulse 103 11/03/2021 3:04 PM EDT    ?Temperature 36.5 ?C (97.7 ?F) 11/03/2021 3:04 PM EDT    ?Respiratory Rate 16 11/03/2021 3:04 PM EDT    ?Oxygen Saturation 99% 11/03/2021 3:04 PM EDT    ?Inhaled Oxygen Concentration - -    ?Weight 42.6 kg (94 lb) 11/03/2021 3:04 PM EDT    ?Height 162.6 cm (5\' 4" ) 11/03/2021 3:04 PM EDT    ?Body Mass Index 16.14 11/03/2021 3:04 PM EDT   ? ? ?PROVIDERS: ?Corey Salts, Corey Mckenzie is PCP ([facility]) ? ? ?LABS: For day of surgery while under anesthesia. See DISCUSSION. ON 11/03/18 his CBC was normal.  ? ? ?EKG: Last EKG seen was for m4/4/14: ST at 11 bpm ? ? ?CV: N/A ? ?Past Medical History:  ?Diagnosis Date  ? Autism   ? Febrile seizures ([place])   ? Mental retardation   ? Microcephaly ([place])   ? Pneumonia 2014  ? ? ?Past Surgical History:  ?Procedure Laterality Date  ? DENTAL RESTORATION/EXTRACTION WITH X-RAY N/A  06/29/2014  ? Procedure: DENTAL RESTORATION/EXTRACTION WITH X-RAY;  Surgeon: Jamison Oka., DDS;  Location: [facility];  Service: Oral Surgery;  Laterality: N/A;  Recall exam, full mouth x-rays and cleaning, two extractions and fillings  ? DENTAL RESTORATION/EXTRACTION WITH X-RAY N/A 01/14/2017  ? Procedure: X-RAY and cleaning of Teeth, Extraction of # 5, Dental Restoration of 4 DO, 5 EXN, 61F, 3L,27 F,  31 DFL, 30 DO,21F;  Surgeon: Verdene Lennert, DDS;  Location: [facility];  Service: Oral Surgery;  Laterality: N/A;  ? DENTAL SURGERY    ? TOOTH EXTRACTION N/A 11/03/2018  ? Procedure: full mouth DENTAL XRAYS, dental exam, CLEANING,  EXTRACTION #6, full mouth debridement, glass ionomers #3 (mol), #21 (f), #30 (f), #31(f);  Surgeon: Verdene Lennert, DDS;  Location: [place];  Service: Oral Surgery;  Laterality: N/A;  ? ? ?MEDICATIONS: ?No current facility-administered medications for this encounter.  ? ? BLACK ELDERBERRY PO  ? doxycycline (VIBRAMYCIN) 100 MG capsule  ? ? ?Myra Gianotti, PA-C ?Surgical Short Stay/Anesthesiology ?[facility] Phone [phone] ?[facility] Phone [phone] ?11/19/2021 12:11 PM ? ? ? ? ? ? ? ? ?

## 2021-11-20 ENCOUNTER — Encounter (HOSPITAL_COMMUNITY)
Admission: RE | Disposition: A | Discharge status: Home / Self Care | Payer: Self-pay | Source: Ambulatory Visit | Attending: Dentistry

## 2021-11-20 ENCOUNTER — Encounter (HOSPITAL_COMMUNITY): Payer: Self-pay

## 2021-11-20 ENCOUNTER — Other Ambulatory Visit: Payer: Self-pay

## 2021-11-20 ENCOUNTER — Ambulatory Visit (HOSPITAL_COMMUNITY): Payer: Medicaid Other | Admitting: Emergency Medicine

## 2021-11-20 ENCOUNTER — Ambulatory Visit (HOSPITAL_COMMUNITY)
Admission: RE | Admit: 2021-11-20 | Discharge: 2021-11-20 | Disposition: A | Discharge status: Home / Self Care | Payer: Medicaid Other | Source: Ambulatory Visit | Attending: Dentistry | Admitting: Dentistry

## 2021-11-20 ENCOUNTER — Ambulatory Visit (HOSPITAL_BASED_OUTPATIENT_CLINIC_OR_DEPARTMENT_OTHER): Payer: Medicaid Other | Admitting: Emergency Medicine

## 2021-11-20 DIAGNOSIS — Z01818 Encounter for other preprocedural examination: Secondary | ICD-10-CM

## 2021-11-20 DIAGNOSIS — K029 Dental caries, unspecified: Secondary | ICD-10-CM | POA: Insufficient documentation

## 2021-11-20 DIAGNOSIS — L709 Acne, unspecified: Secondary | ICD-10-CM | POA: Insufficient documentation

## 2021-11-20 DIAGNOSIS — F840 Autistic disorder: Secondary | ICD-10-CM

## 2021-11-20 DIAGNOSIS — Z681 Body mass index (BMI) 19 or less, adult: Secondary | ICD-10-CM | POA: Insufficient documentation

## 2021-11-20 DIAGNOSIS — F79 Unspecified intellectual disabilities: Secondary | ICD-10-CM | POA: Diagnosis not present

## 2021-11-20 DIAGNOSIS — G808 Other cerebral palsy: Secondary | ICD-10-CM | POA: Insufficient documentation

## 2021-11-20 DIAGNOSIS — R636 Underweight: Secondary | ICD-10-CM | POA: Insufficient documentation

## 2021-11-20 HISTORY — PX: DENTAL RESTORATION/EXTRACTION WITH X-RAY: SHX5796

## 2021-11-20 HISTORY — DX: Cerebral palsy, unspecified: G80.9

## 2021-11-20 LAB — COMPREHENSIVE METABOLIC PANEL
ALT: 19 U/L (ref 0–44)
AST: 28 U/L (ref 15–41)
Albumin: 3.6 g/dL (ref 3.5–5.0)
Alkaline Phosphatase: 64 U/L (ref 38–126)
Anion gap: 5 (ref 5–15)
BUN: 15 mg/dL (ref 6–20)
CO2: 28 mmol/L (ref 22–32)
Calcium: 9 mg/dL (ref 8.9–10.3)
Chloride: 105 mmol/L (ref 98–111)
Creatinine, Ser: 0.78 mg/dL (ref 0.61–1.24)
GFR, Estimated: >60 mL/min (ref >60)
Glucose, Bld: 94 mg/dL (ref 70–99)
Potassium: 3.8 mmol/L (ref 3.5–5.1)
Sodium: 138 mmol/L (ref 135–145)
Total Bilirubin: 0.9 mg/dL (ref 0.3–1.2)
Total Protein: 6.1 g/dL — ABNORMAL LOW (ref 6.5–8.1)

## 2021-11-20 LAB — LIPID PANEL
Cholesterol: 180 mg/dL (ref 0–200)
HDL: 68 mg/dL (ref >40)
LDL Cholesterol: 60 mg/dL (ref 0–99)
Total CHOL/HDL Ratio: 2.6 RATIO
Triglycerides: 260 mg/dL — ABNORMAL HIGH (ref <150)
VLDL: 52 mg/dL — ABNORMAL HIGH (ref 0–40)

## 2021-11-20 LAB — TSH: TSH: 1.858 u[IU]/mL (ref 0.350–4.500)

## 2021-11-20 LAB — CBC WITH DIFFERENTIAL/PLATELET
Abs Immature Granulocytes: 0.01 10*3/uL (ref 0.00–0.07)
Basophils Absolute: 0 10*3/uL (ref 0.0–0.1)
Basophils Relative: 0 %
Eosinophils Absolute: 0.1 10*3/uL (ref 0.0–0.5)
Eosinophils Relative: 3 %
HCT: 46.8 % (ref 39.0–52.0)
Hemoglobin: 15.3 g/dL (ref 13.0–17.0)
Immature Granulocytes: 0 %
Lymphocytes Relative: 39 %
Lymphs Abs: 1.9 10*3/uL (ref 0.7–4.0)
MCH: 29.7 pg (ref 26.0–34.0)
MCHC: 32.7 g/dL (ref 30.0–36.0)
MCV: 90.7 fL (ref 80.0–100.0)
Monocytes Absolute: 0.5 10*3/uL (ref 0.1–1.0)
Monocytes Relative: 9 %
Neutro Abs: 2.4 10*3/uL (ref 1.7–7.7)
Neutrophils Relative %: 49 %
Platelets: 230 10*3/uL (ref 150–400)
RBC: 5.16 MIL/uL (ref 4.22–5.81)
RDW: 13.3 % (ref 11.5–15.5)
WBC: 5 10*3/uL (ref 4.0–10.5)
nRBC: 0 % (ref 0.0–0.2)

## 2021-11-20 LAB — VITAMIN D 25 HYDROXY (VIT D DEFICIENCY, FRACTURES): Vit D, 25-Hydroxy: 24.58 ng/mL — ABNORMAL LOW (ref 30–100)

## 2021-11-20 LAB — HEMOGLOBIN A1C
Hgb A1c MFr Bld: 6.1 % — ABNORMAL HIGH (ref 4.8–5.6)
Mean Plasma Glucose: 128.37 mg/dL

## 2021-11-20 SURGERY — DENTAL RESTORATION/EXTRACTION WITH X-RAY
Anesthesia: General | Site: Mouth

## 2021-11-20 MED ORDER — ACETAMINOPHEN 10 MG/ML IV SOLN
INTRAVENOUS | Status: DC | PRN
  Administered 2021-11-20: 1000 mg via INTRAVENOUS

## 2021-11-20 MED ORDER — ORAL CARE MOUTH RINSE: 15.0000 mL | Freq: Once | OROMUCOSAL | Status: DC

## 2021-11-20 MED ORDER — DEXAMETHASONE SODIUM PHOSPHATE 10 MG/ML IJ SOLN
INTRAMUSCULAR | Status: DC | PRN
  Administered 2021-11-20: 5 mg via INTRAVENOUS

## 2021-11-20 MED ORDER — SUCCINYLCHOLINE CHLORIDE 200 MG/10ML IV SOSY
PREFILLED_SYRINGE | INTRAVENOUS | Status: DC | PRN
  Administered 2021-11-20: 100 mg via INTRAVENOUS

## 2021-11-20 MED ORDER — LIDOCAINE-EPINEPHRINE 2 %-1:100000 IJ SOLN
INTRAMUSCULAR | Status: AC
  Filled 2021-11-20: qty 6.8

## 2021-11-20 MED ORDER — HYDROMORPHONE HCL 1 MG/ML IJ SOLN: 0.2500 mg | INTRAMUSCULAR | Status: DC | PRN

## 2021-11-20 MED ORDER — OXYMETAZOLINE HCL 0.05 % NA SOLN
NASAL | Status: DC | PRN
  Administered 2021-11-20: 1 via NASAL

## 2021-11-20 MED ORDER — PROPOFOL 10 MG/ML IV BOLUS
INTRAVENOUS | Status: DC | PRN
Start: 2021-11-20 — End: 2021-11-20
  Administered 2021-11-20: 150 mg via INTRAVENOUS

## 2021-11-20 MED ORDER — MIDAZOLAM HCL 2 MG/ML PO SYRP
ORAL_SOLUTION | ORAL | Status: AC
Start: 2021-11-20 — End: 2021-11-20
  Administered 2021-11-20: 15 mg via ORAL
  Filled 2021-11-20: qty 10

## 2021-11-20 MED ORDER — DEXAMETHASONE SODIUM PHOSPHATE 10 MG/ML IJ SOLN
INTRAMUSCULAR | Status: AC
  Filled 2021-11-20: qty 1

## 2021-11-20 MED ORDER — STERILE WATER FOR IRRIGATION IR SOLN
Status: DC | PRN
  Administered 2021-11-20: 1000 mL

## 2021-11-20 MED ORDER — OXYCODONE HCL 5 MG/5ML PO SOLN: 5.0000 mg | Freq: Once | ORAL | Status: DC | PRN

## 2021-11-20 MED ORDER — OXYMETAZOLINE HCL 0.05 % NA SOLN
NASAL | Status: AC
  Filled 2021-11-20: qty 30

## 2021-11-20 MED ORDER — CHLORHEXIDINE GLUCONATE 0.12 % MT SOLN
OROMUCOSAL | Status: AC
  Filled 2021-11-20: qty 15

## 2021-11-20 MED ORDER — PROPOFOL 10 MG/ML IV BOLUS
INTRAVENOUS | Status: AC
  Filled 2021-11-20: qty 20

## 2021-11-20 MED ORDER — LACTATED RINGERS IV SOLN: INTRAVENOUS | Status: DC

## 2021-11-20 MED ORDER — MEPERIDINE HCL 25 MG/ML IJ SOLN: 6.2500 mg | INTRAMUSCULAR | Status: DC | PRN

## 2021-11-20 MED ORDER — OXYCODONE HCL 5 MG PO TABS: 5.0000 mg | ORAL_TABLET | Freq: Once | ORAL | Status: DC | PRN

## 2021-11-20 MED ORDER — ACETAMINOPHEN 10 MG/ML IV SOLN
INTRAVENOUS | Status: AC
  Filled 2021-11-20: qty 100

## 2021-11-20 MED ORDER — ONDANSETRON HCL 4 MG/2ML IJ SOLN
INTRAMUSCULAR | Status: AC
  Filled 2021-11-20: qty 2

## 2021-11-20 MED ORDER — MIDAZOLAM HCL 2 MG/2ML IJ SOLN: 0.5000 mg | Freq: Once | INTRAMUSCULAR | Status: DC | PRN

## 2021-11-20 MED ORDER — FENTANYL CITRATE (PF) 250 MCG/5ML IJ SOLN
INTRAMUSCULAR | Status: AC
  Filled 2021-11-20: qty 5

## 2021-11-20 MED ORDER — CHLORHEXIDINE GLUCONATE 0.12 % MT SOLN: 15.0000 mL | Freq: Once | OROMUCOSAL | Status: DC

## 2021-11-20 MED ORDER — PHENYLEPHRINE HCL (PRESSORS) 10 MG/ML IV SOLN
INTRAVENOUS | Status: AC
  Filled 2021-11-20: qty 1

## 2021-11-20 MED ORDER — MIDAZOLAM HCL 2 MG/ML PO SYRP
15.0000 mg | ORAL_SOLUTION | Freq: Once | ORAL | Status: AC
  Filled 2021-11-20: qty 8

## 2021-11-20 MED ORDER — PROPOFOL 500 MG/50ML IV EMUL: INTRAVENOUS | Status: DC | PRN

## 2021-11-20 MED ORDER — KETOROLAC TROMETHAMINE 30 MG/ML IJ SOLN
INTRAMUSCULAR | Status: AC
  Filled 2021-11-20: qty 1

## 2021-11-20 MED ORDER — PHENYLEPHRINE 80 MCG/ML (10ML) SYRINGE FOR IV PUSH (FOR BLOOD PRESSURE SUPPORT)
PREFILLED_SYRINGE | INTRAVENOUS | Status: DC | PRN
  Administered 2021-11-20 (×3): 80 ug via INTRAVENOUS

## 2021-11-20 MED ORDER — ONDANSETRON HCL 4 MG/2ML IJ SOLN
INTRAMUSCULAR | Status: DC | PRN
Start: 2021-11-20 — End: 2021-11-20
  Administered 2021-11-20: 4 mg via INTRAVENOUS

## 2021-11-20 MED ORDER — PHENYLEPHRINE HCL-NACL 20-0.9 MG/250ML-% IV SOLN
INTRAVENOUS | Status: DC | PRN
  Administered 2021-11-20: 40 ug/min via INTRAVENOUS

## 2021-11-20 MED ORDER — MIDAZOLAM HCL 2 MG/2ML IJ SOLN
INTRAMUSCULAR | Status: AC
  Filled 2021-11-20: qty 2

## 2021-11-20 MED ORDER — ACETAMINOPHEN 500 MG PO TABS: 1000.0000 mg | ORAL_TABLET | Freq: Once | ORAL | Status: DC

## 2021-11-20 SURGICAL SUPPLY — 25 items
BLADE SURG 15 STRL LF DISP TIS (BLADE) ×1 IMPLANT
BLADE SURG 15 STRL SS (BLADE)
BUR ROUND PRECISION 4.0 (BURR) IMPLANT
CANISTER SUCT 3000ML PPV (MISCELLANEOUS) ×2 IMPLANT
COVER BACK TABLE 60X90IN (DRAPES) ×2 IMPLANT
COVER MAYO STAND STRL (DRAPES) ×2 IMPLANT
COVER PROBE W GEL 5X96 (DRAPES) IMPLANT
COVER SURGICAL LIGHT HANDLE (MISCELLANEOUS) ×2 IMPLANT
DRAPE HALF SHEET 40X57 (DRAPES) ×2 IMPLANT
GAUZE SPONGE 4X4 16PLY XRAY LF (GAUZE/BANDAGES/DRESSINGS) ×2 IMPLANT
GLOVE BIO SURGEON STRL SZ 6.5 (GLOVE) ×2 IMPLANT
GOWN STRL REUS W/ TWL LRG LVL3 (GOWN DISPOSABLE) ×2 IMPLANT
GOWN STRL REUS W/TWL LRG LVL3 (GOWN DISPOSABLE) ×2
KIT BASIN OR (CUSTOM PROCEDURE TRAY) ×2 IMPLANT
KIT TURNOVER KIT B (KITS) ×2 IMPLANT
NDL DENTAL 27 LONG (NEEDLE) ×1 IMPLANT
NEEDLE DENTAL 27 LONG (NEEDLE) IMPLANT
PAD ARMBOARD 7.5X6 YLW CONV (MISCELLANEOUS) ×4 IMPLANT
SPONGE SURGIFOAM ABS GEL SZ50 (HEMOSTASIS) IMPLANT
SUT CHROMIC 3 0 PS 2 (SUTURE) ×1 IMPLANT
TOOTHBRUSH ADULT (PERSONAL CARE ITEMS) ×2 IMPLANT
TOWEL GREEN STERILE (TOWEL DISPOSABLE) ×2 IMPLANT
TUBE CONNECTING 12X1/4 (SUCTIONS) ×2 IMPLANT
WATER TABLETS ICX (MISCELLANEOUS) ×2 IMPLANT
YANKAUER SUCT BULB TIP NO VENT (SUCTIONS) ×2 IMPLANT

## 2021-11-20 NOTE — H&P (Signed)
H&P reviewed. Stable for surgery Dinero Chavira H Kemiah Booz, DMD  

## 2021-11-20 NOTE — Progress Notes (Signed)
History and physical faxed and placed in chart.  ?

## 2021-11-20 NOTE — Brief Op Note (Signed)
11/20/2021 ? ?9:20 AM ? ?PATIENT:  MALAKHAI BEITLER  36 y.o. male ? ?PRE-OPERATIVE DIAGNOSIS:  DENTAL CARIES ? ?POST-OPERATIVE DIAGNOSIS:  DENTAL CARIES ? ?PROCEDURE:  Procedure(s): ?DENTAL RESTORATION WITH X-RAY--cleaning, flouride treatment, fillings 60M, , , , 20D, 42F, 32F, 31BL (N/A) ? ?SURGEON:  Surgeon(s) and Role: ?   * Rocky Morel, Arley Phenix, DMD - Primary ? ?PHYSICIAN ASSISTANT:  ? ?ASSISTANTS: April Riggsbee and hospital staff  ? ?ANESTHESIA:   general ? ?EBL:  1 mL  ? ?BLOOD ADMINISTERED:none ? ?DRAINS: none  ? ?LOCAL MEDICATIONS USED:  NONE ? ?SPECIMEN:  No Specimen ? ?DISPOSITION OF SPECIMEN:  N/A ? ?COUNTS:  YES ? ?TOURNIQUET:  * No tourniquets in log * ? ?DICTATION: .Note written in EPIC ? ?PLAN OF CARE: Discharge to home after PACU ? ?PATIENT DISPOSITION:  PACU - hemodynamically stable. ?  ?Delay start of Pharmacological VTE agent (>24hrs) due to surgical blood loss or risk of bleeding: not applicable ? ?

## 2021-11-20 NOTE — Anesthesia Postprocedure Evaluation (Signed)
Anesthesia Post Note ? ?Patient: Corey Mckenzie ? ?Procedure(s) Performed: DENTAL RESTORATION WITH X-RAY--cleaning, flouride treatment, fillings 69M, , , , 20D, 65F, 44F, 31BL (Mouth) ? ?  ? ?Patient location during evaluation: PACU ?Anesthesia Type: General ?Level of consciousness: awake and alert and patient uncooperative ?Pain management: pain level controlled ?Vital Signs Assessment: post-procedure vital signs reviewed and stable ?Respiratory status: spontaneous breathing, nonlabored ventilation and respiratory function stable ?Cardiovascular status: blood pressure returned to baseline and stable ?Postop Assessment: no apparent nausea or vomiting ?Anesthetic complications: no ? ? ?No notable events documented. ? ?Last Vitals:  ?Vitals:  ? 11/20/21 0624 11/20/21 0922  ?BP: (!) 131/96 106/76  ?Pulse: 78 63  ?Resp: 18 (!) 21  ?Temp:  (!) 36.2 ?C  ?SpO2: 98% 98%  ?  ?Last Pain:  ?Vitals:  ? 11/20/21 0922  ?TempSrc:   ?PainSc: 0-No pain  ? ? ?  ?  ?  ?  ?  ?  ? ?Lynnea Vandervoort,E. Sumayah Bearse ? ? ? ? ?

## 2021-11-20 NOTE — Transfer of Care (Signed)
Immediate Anesthesia Transfer of Care Note ? ?Patient: Corey Mckenzie ? ?Procedure(s) Performed: DENTAL RESTORATION WITH X-RAY--cleaning, flouride treatment, fillings 22M, , , , 20D, 15F, 51F, 31BL (Mouth) ? ?Patient Location: PACU ? ?Anesthesia Type:General ? ?Level of Consciousness: drowsy ? ?Airway & Oxygen Therapy: Patient Spontanous Breathing ? ?Post-op Assessment: Report given to RN and Post -op Vital signs reviewed and stable ? ?Post vital signs: Reviewed and stable ? ?Last Vitals:  ?Vitals Value Taken Time  ?BP 106/76 11/20/21 0922  ?Temp 36.2 ?C 11/20/21 0922  ?Pulse 67 11/20/21 0926  ?Resp 23 11/20/21 0926  ?SpO2 97 % 11/20/21 0926  ?Vitals shown include unvalidated device data. ? ?Last Pain:  ?Vitals:  ? 11/20/21 0922  ?TempSrc:   ?PainSc: 0-No pain  ?   ? ?  ? ?Complications: No notable events documented. ?

## 2021-11-20 NOTE — Op Note (Signed)
[facility] ? ?11/20/2021 ?RUBEL HECKARD ?[id number] ? 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 Dental Care provided in OR for medically necessary treatment. ? ?Surgeon: Joanna Hews, DMD ? ?Assistant: April Riggsbee and hospital staff. ? ?Anesthesia: General ? ?Procedure: The patient was brought into the operating room and placed on the table in a supine position.  General anesthesia was administered via nasal intubation.  The patient was prepped and draped in the usual manner for an intra-oral general dentistry procedure. The oropharynx was suctioned and a moistened oropharyngeal throat pack was placed.  ?  ?A full intra-oral exam including all hard and soft tissues was performed.  ?Type of Exam: Recall  ? ?Soft Tissue Exam: ?Floor of the mouth: Normal ?Buccal mucosa: Normal ?Soft palate: Normal ?Hard palate: Normal ?Tongue: Normal ?Gingival: Normal ?Frenum: Normal ? ?Hard tissue exam:  ?Present: # 3,4,7-12, 14-15 ,20-31 ?Missing: # [id number], 16, [id number] ?Radiographic findings decay: # 22M, 565M, 93M ? ? ?Full mouth series of digital radiographs taken and reviewed. A comprehensive treatment plan was developed. ? ?Operative care was accomplished in a standard fashion using high/low speed drills with copious irrigation. ? ?Local Anes:None ?The estimated blood loss was 1 mls.  ? ?Upon completion of all procedures the oropharynx was irrigated of all debris. Mouth was suctioned dry and a posterior throat pack was carefully removed with constant suction. Hemostasis was established and a gauze pack was placed as an intraoral pressure dressing. After spontaneous respirations the patient was extubated and transported to the Post-Anesthesia care unit in awake but in a sedated condition. The patient tolerated the procedure well and without complications.  An explanation of procedures and extractions were given to mother and family. ? ?Operative  Procedures:  ?Prophy: Yes ?Glass Ionomers: # 65M (coronal,smooth,dentin), (coronal,chewing,dentin), 565MF(coronal,chewing,dentin), 93MO(coronal,chewing,dentin), 20D(coronal,smooth,dentin), 32F (coronal,smooth,dentin), 44F(coronal,smooth,dentin), 31BL (coronal,smooth,dentin) ?Fluoride varnish: Yes ? ? ? ?Postoperative Instructions: ?Given to patient representative. Prescription for prevident (sodium fluoride toothpaste) will be called into patient's pharmacy ? ? ?Joanna Hews, DMD  ?

## 2021-11-20 NOTE — Anesthesia Procedure Notes (Signed)
Procedure Name: Intubation ?Date/Time: 11/20/2021 7:40 AM ?Performed by: Gwyndolyn Saxon, CRNA ?Pre-anesthesia Checklist: Patient identified, Emergency Drugs available, Suction available and Patient being monitored ?Patient Re-evaluated:Patient Re-evaluated prior to induction ?Oxygen Delivery Method: Circle system utilized ?Induction Type: Inhalational induction ?Ventilation: Mask ventilation without difficulty ?Laryngoscope Size: Sabra Heck and 2 ?Grade View: Grade I ?Nasal Tubes: Right, Nasal prep performed, Nasal Rae and Magill forceps- large, utilized ?Tube size: 7.0 mm ?Number of attempts: 1 ?Placement Confirmation: ETT inserted through vocal cords under direct vision, positive ETCO2 and breath sounds checked- equal and bilateral ?Tube secured with: Tape ?Dental Injury: Teeth and Oropharynx as per pre-operative assessment  ? ? ? ? ?

## 2021-11-21 ENCOUNTER — Encounter (HOSPITAL_COMMUNITY): Payer: Self-pay | Admitting: Dentistry
